# Patient Record
Sex: Male | Born: 1970 | Race: White | Hispanic: No | Marital: Married | Smoking: Former smoker
Health system: Southern US, Community
[De-identification: ages and names within clinical notes are randomized; demographics above are authoritative.]

## PROBLEM LIST (undated history)

## (undated) DIAGNOSIS — F32A Depression, unspecified: Secondary | ICD-10-CM

## (undated) HISTORY — PX: OTHER SURGICAL HISTORY: SHX169

## (undated) HISTORY — PX: KNEE SURGERY: SHX244

---

## 2000-01-29 ENCOUNTER — Encounter: Payer: Self-pay | Admitting: Family Medicine

## 2000-01-29 ENCOUNTER — Ambulatory Visit (HOSPITAL_COMMUNITY): Admission: RE | Admit: 2000-01-29 | Discharge: 2000-01-29 | Payer: Self-pay | Admitting: Family Medicine

## 2003-05-01 ENCOUNTER — Emergency Department (HOSPITAL_COMMUNITY): Admission: EM | Admit: 2003-05-01 | Discharge: 2003-05-01 | Payer: Self-pay | Admitting: Emergency Medicine

## 2010-01-16 ENCOUNTER — Ambulatory Visit (HOSPITAL_COMMUNITY): Admission: RE | Admit: 2010-01-16 | Discharge: 2010-01-16 | Payer: Self-pay | Admitting: Orthopedic Surgery

## 2010-03-03 ENCOUNTER — Encounter (HOSPITAL_COMMUNITY): Admission: RE | Admit: 2010-03-03 | Discharge: 2010-03-06 | Payer: Self-pay | Admitting: Orthopedic Surgery

## 2010-03-09 ENCOUNTER — Encounter (HOSPITAL_COMMUNITY): Admission: RE | Admit: 2010-03-09 | Discharge: 2010-04-08 | Payer: Self-pay | Admitting: Orthopedic Surgery

## 2010-04-10 ENCOUNTER — Encounter (HOSPITAL_COMMUNITY)
Admission: RE | Admit: 2010-04-10 | Discharge: 2010-05-10 | Payer: Self-pay | Source: Home / Self Care | Admitting: Orthopedic Surgery

## 2010-05-13 ENCOUNTER — Encounter (HOSPITAL_COMMUNITY)
Admission: RE | Admit: 2010-05-13 | Discharge: 2010-06-12 | Payer: Self-pay | Source: Home / Self Care | Attending: Orthopedic Surgery | Admitting: Orthopedic Surgery

## 2010-06-28 ENCOUNTER — Encounter: Payer: Self-pay | Admitting: Orthopedic Surgery

## 2011-03-02 ENCOUNTER — Ambulatory Visit (HOSPITAL_COMMUNITY)
Admission: RE | Admit: 2011-03-02 | Discharge: 2011-03-02 | Disposition: A | Discharge status: Home / Self Care | Payer: 59 | Source: Ambulatory Visit | Attending: Specialist | Admitting: Specialist

## 2011-03-02 DIAGNOSIS — M25669 Stiffness of unspecified knee, not elsewhere classified: Secondary | ICD-10-CM | POA: Insufficient documentation

## 2011-03-02 DIAGNOSIS — M25569 Pain in unspecified knee: Secondary | ICD-10-CM | POA: Insufficient documentation

## 2011-03-02 DIAGNOSIS — Z9889 Other specified postprocedural states: Secondary | ICD-10-CM | POA: Insufficient documentation

## 2011-03-02 DIAGNOSIS — IMO0001 Reserved for inherently not codable concepts without codable children: Secondary | ICD-10-CM | POA: Insufficient documentation

## 2011-03-02 DIAGNOSIS — M6281 Muscle weakness (generalized): Secondary | ICD-10-CM | POA: Insufficient documentation

## 2011-03-02 DIAGNOSIS — R262 Difficulty in walking, not elsewhere classified: Secondary | ICD-10-CM | POA: Insufficient documentation
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
## 2011-03-02 NOTE — Patient Instructions (Addendum)
A 

## 2011-03-09 ENCOUNTER — Ambulatory Visit (HOSPITAL_COMMUNITY)
Admission: RE | Admit: 2011-03-09 | Discharge: 2011-03-09 | Disposition: A | Discharge status: Home / Self Care | Payer: 59 | Source: Ambulatory Visit | Attending: Internal Medicine | Admitting: Internal Medicine

## 2011-03-09 DIAGNOSIS — M25669 Stiffness of unspecified knee, not elsewhere classified: Secondary | ICD-10-CM | POA: Insufficient documentation

## 2011-03-09 DIAGNOSIS — IMO0001 Reserved for inherently not codable concepts without codable children: Secondary | ICD-10-CM | POA: Insufficient documentation

## 2011-03-09 DIAGNOSIS — M6281 Muscle weakness (generalized): Secondary | ICD-10-CM | POA: Insufficient documentation

## 2011-03-09 DIAGNOSIS — R262 Difficulty in walking, not elsewhere classified: Secondary | ICD-10-CM | POA: Insufficient documentation

## 2011-03-09 DIAGNOSIS — M25569 Pain in unspecified knee: Secondary | ICD-10-CM | POA: Insufficient documentation

## 2011-03-09 NOTE — Progress Notes (Signed)
Physical Therapy Treatment Patient Details  Name: Justin Macdonald MRN: [id number] Date of Birth: 1970-10-22  Today's Date: 03/09/2011 Time: 4098-1191 Time Calculation (min): 34 min Charges: 30' TE, 4' Gt Visit#: 2  of 12   Re-eval: 04/01/11    Subjective: Symptoms/Limitations Symptoms: I have been working on my exercises at home and I have a little bit of a contraction.  No pain reported since Monday.  He has most nagging pain with SLR under the knee cap.  Pain Assessment Currently in Pain?: No/denies  Precautions/Restrictions  Precautions Precaution Comments: 50% for 6 weeks (11/6).  0-30 degrees for 3 weeks (until 10/11) , 0-70 6 weeks (11/6), no valgus  Mobility (including Balance)   Crutches 50% WB    Exercise/Treatments Stretches Passive Hamstring Stretch: 4 reps;30 seconds Aerobic   Machines for Strengthening   Plyometrics   Standing Knee Flexion: 15 reps Functional Squat: Limitations Functional Squat Limitations: this was not completed first session secondary to WB percautions. Error LMM Gait Training: 4' to encourage 50% WB with heel/toe walking and knee flexion Other Standing Knee Exercises: Tandem Stance w/R foot posterior and BUE support for 50% WB 3x30 sec Supine Quad Sets: 10 reps;Limitations Quad Sets Limitations: 10 sec hold Short Arc Quad Sets: 15 reps;Limitations Short Arc Quad Sets Limitations: small bolster 5" hold Straight Leg Raises: Right;10 reps Other Supine Knee Exercises: Hamstring/Quad Isometrics with heel into ground and knee at 30 degrees 10x10 sec hold Sidelying Hip ABduction: Right;10 reps Hip ADduction: Right;10 reps Prone  Hip Extension: Right;10 reps      Physical Therapy Assessment and Plan PT Assessment and Plan Clinical Impression Statement: Pt with notable improvement in quadricep contraction at the beginning and the end of treatment.  Continues to make significant progress in strength.  PT Plan: Follow protocol in  protocol book.  50% WB and 0-30 degrees until 03/11/11, 0-70 degrees until 04/08/11    Goals    Problem List Patient Active Problem List  Diagnoses  . S/P ACL reconstruction  . Knee pain  . Knee stiffness       Petrice Beedy 03/09/2011, 10:18 AM

## 2011-03-10 ENCOUNTER — Ambulatory Visit (HOSPITAL_COMMUNITY)
Admission: RE | Admit: 2011-03-10 | Discharge: 2011-03-10 | Disposition: A | Discharge status: Home / Self Care | Payer: 59 | Source: Ambulatory Visit | Attending: Specialist | Admitting: Specialist

## 2011-03-10 NOTE — Progress Notes (Addendum)
Physical Therapy Treatment Patient Details  Name: Justin Macdonald MRN: [id number] Date of Birth: Oct 20, 1970  Today's Date: 03/10/2011 Time: 4098-1191 Time Calculation (min): 41 min Charges: TE 20' Visit#: 3  of 12   Re-eval: 04/01/11    Subjective: Symptoms/Limitations Symptoms: Pt reports he still does not have any pain.  Comes in today initally without WB, and once cued to WB has appropriate heel to toe contact with crutches.  Pain Assessment Currently in Pain?: No/denies  Precautions/Restrictions  Precautions Precaution Comments: 50% for 6 weeks (11/6).  0-30 degrees for 3 weeks (until 10/11) , 0-70 6 weeks (11/6), no valgus  Mobility (including Balance)   Ambulating on crutches    Exercise/Treatments   Standing Heel Raises: 15 reps;Limitations Heel Raises Limitations: 50% WB Terminal Knee Extension: Right;10 reps;Limitations Terminal Knee Extension Limitations: 10 sec hold Functional Squat: 15 reps Functional Squat Limitations: 0-30 degress w/50% WB Other Standing Knee Exercises: 50% WB training to complete Standing exercises Other Standing Knee Exercises: Toe Raises 15x 50% WB Seated Long Arc Quad: Right;15 reps Supine Quad Sets: 10 reps;Limitations Quad Sets Limitations: 10 sec hold Short Arc [employer] Sets: 15 reps;Limitations Short Arc Quad Sets Limitations: 1 # small bolster Straight Leg Raises: Right;15 reps Other Supine Knee Exercises: Hamstring/Quad Isometrics with heel into ground and knee at 30 degrees 10x10 sec hold Other Supine Knee Exercises: Isometric Hip Adduction  Sidelying Hip ABduction: Right;15 reps Hip ADduction: Right;15 reps Prone  Hamstring Curl: 15 reps Hamstring Curl Limitations: 0-30 degrees only Hip Extension: Right;15 reps    Physical Therapy Assessment and Plan PT Assessment and Plan Clinical Impression Statement: Pt was able to independently demonstrate 50% WB status on scale today and completed standing activities while  maintaining status. Pt tolerated all new exercises well with moderate fatigue with endurance exercises.  PT Plan: Asses tolerance to today's treatment. Add 1# to [facility] and hamstring curls, HS curl in standing. Follow protocol.  50% WB and 0-30 degrees until 03/11/11, 0-70 degrees until 04/08/11.    Goals    Problem List Patient Active Problem List  Diagnoses  . S/P ACL reconstruction  . Knee pain  . Knee stiffness    PT - End of Session Activity Tolerance: Patient tolerated treatment well  Ronelle Michie 03/10/2011, 10:12 AM

## 2011-03-12 ENCOUNTER — Ambulatory Visit (HOSPITAL_COMMUNITY)
Admission: RE | Admit: 2011-03-12 | Discharge: 2011-03-12 | Disposition: A | Discharge status: Home / Self Care | Payer: 59 | Source: Ambulatory Visit

## 2011-03-12 NOTE — Progress Notes (Signed)
Physical Therapy Treatment Patient Details  Name: Justin Macdonald MRN: [id number] Date of Birth: 06/06/1971  Today's Date: 03/12/2011 Time: 4098-1191 Time Calculation (min): 50 min Visit#: 4  of 12   Re-eval: 04/01/11  Charge: therex 40 min Ice 10  Subjective: Symptoms/Limitations Symptoms: Knee feeling good, no pain today.  Placed foot on weight scale, pt following 50% WB precautions appropriately.  Entered with knee brace amb with crutches Pain Assessment Currently in Pain?: No/denies  Objective: amb with knee brace, crutches 50% WB status   Exercise/Treatments Standing- follow 50% WB status Heel Raises: 20 reps  Toe Raises 20 TKE 20x 10" Functional squats 2 sets 10 reps H/S Curl 20 reps Seated  Long Arc Quad: Right;20 reps 0-30 degrees Supine  Quad Sets: 20x 10"  Short Arc Quad Sets: 15 reps; 2# small bolster  Straight Leg Raises: Right;20 reps  Other Supine Knee Exercises: Hamstring/Quad Isometrics with heel into ground and knee at 30 degrees 15x10 sec hold  Sidelying  Hip ABduction: Right;20 reps  Hip ADduction: Right;20 reps  Prone  Hamstring Curl: 20 reps  Hamstring Curl Limitations: 0-30 degrees only  Hip Extension: Right;20 reps Ice to reduce swelling 10 min  Modalities Modalities: Cryotherapy Cryotherapy Number Minutes Cryotherapy: 10 Minutes Cryotherapy Location: Knee Type of Cryotherapy: Ice pack  Physical Therapy Assessment and Plan PT Assessment and Plan Clinical Impression Statement: Increased reps with no c/o, moderate quad fatigue noted with endurance exercises.  Pt independently demostrated 50% WB status and completed standing therex with proper WB status.  PT Plan: Continue with current POC, pt MD appt 03/15/2011.  Add 1# to SLR and hamstring curls next session.    Goals    Problem List Patient Active Problem List  Diagnoses  . S/P ACL reconstruction  . Knee pain  . Knee stiffness    PT - End of Session Activity Tolerance: Patient  tolerated treatment well General Behavior During Session: [facility] for tasks performed Cognition: [facility] for tasks performed  [name] 03/12/2011, 11:02 AM

## 2011-03-15 ENCOUNTER — Ambulatory Visit (HOSPITAL_COMMUNITY)
Admission: RE | Admit: 2011-03-15 | Discharge: 2011-03-15 | Disposition: A | Discharge status: Home / Self Care | Payer: 59 | Source: Ambulatory Visit | Attending: Specialist | Admitting: Specialist

## 2011-03-15 NOTE — Progress Notes (Signed)
Physical Therapy Treatment Patient Details  Name: Justin Macdonald MRN: [id number] Date of Birth: 09/23/1970  Today's Date: 03/15/2011 Time: 2440-1027 Time Calculation (min): 42 min Visit#: 5  of 12   Re-eval: 04/01/11  Charge: therex 40 min  Subjective: Symptoms/Limitations Symptoms: Pain free today, MD happy with progress.  Increased brace to 90 degrees flexion, continue touch down weight bearing til 6 weeks from surgery. Pain Assessment Currently in Pain?: No/denies  Objective:   Exercise/Treatments Standing- follow 50% WB status  Heel Raises: 20 reps  Toe Raises 20  TKE 20x 10"  Functional squats 2 sets 10 reps  H/S Curl 20 reps  Seated  Long Arc Quad: Right;20 reps  Supine  Quad Sets: 20x 10"  Short Arc Quad Sets: 15 reps; 2# large bolster  Straight Leg Raises: Right;20 reps 2# Other Supine Knee Exercises: Hamstring/Quad Isometrics with heel into ground and knee at 30 degrees 20x10 sec hold  Sidelying  Hip ABduction: Right;20 reps 2# Hip ADduction: Right;20 reps 2# Prone  Hamstring Curl: 20 reps 2# Hip Extension: Right;20 reps 2#   Physical Therapy Assessment and Plan PT Assessment and Plan Clinical Impression Statement: Strength improving, able to add 2# to therex without difficulty.  Pt able to achieve 45 degree knee flexion actively against gravity with 2#.  PT Plan: Continue POC per protocol.    Goals    Problem List Patient Active Problem List  Diagnoses  . S/P ACL reconstruction  . Knee pain  . Knee stiffness    PT - End of Session Activity Tolerance: Patient tolerated treatment well General Behavior During Session: [facility] for tasks performed Cognition: [facility] for tasks performed  [name] 03/15/2011, 1:46 PM

## 2011-03-17 ENCOUNTER — Ambulatory Visit (HOSPITAL_COMMUNITY)
Admission: RE | Admit: 2011-03-17 | Discharge: 2011-03-17 | Disposition: A | Discharge status: Home / Self Care | Payer: 59 | Source: Ambulatory Visit | Attending: Specialist | Admitting: Specialist

## 2011-03-17 NOTE — Progress Notes (Signed)
Physical Therapy Treatment Patient Details  Name: Justin Macdonald MRN: [id number] Date of Birth: 1970/10/13  Today's Date: 03/17/2011 Time: 1478-2956 Time Calculation (min): 54 min Visit#: 6  of 12   Re-eval: 04/01/11 Diagnosis: S/P R ACL, MCL allograft reconstruction w/lateral menisectomy Charges:  therex 38', ice 10'   Subjective:  Pt. States he's doing good today; no pain   Precautions/Restrictions  Precautions Precaution Comments: 50% for 6 weeks (11/6).  0-30 degrees for 3 weeks (until 10/11) , 0-70 6 weeks (11/6), no valgus  Exercise/Treatments  Standing- follow 50% WB status (until 6 wks post-op) Heel Raises: 20 reps  Toe Raises 20 reps TKE 20x 10" with blue theraband Functional squats 2 sets 10 reps  H/S Curl 20 reps with TKF using 4"step Seated  Long Arc Quad: Right;20 reps  Cybex hamstring 3pl BLE 2X10reps Cybex leg press 2.5pl RLE 2X10reps Supine  Quad Sets: 20x 10"  Short Arc Quad Sets: 15 reps; 2# large bolster  Straight Leg Raises: Right;20 reps 2#  Other Supine Knee Exercises: Hamstring/Quad Isometrics with heel into ground and knee at 30 degrees 20x10 sec hold  Sidelying  Hip ABduction: Right;20 reps 2#  Hip ADduction: Right;20 reps 2#  Prone  Hamstring Curl: 20 reps 2#  Hip Extension: Right;20 reps 2#  Modalities Modalities: Cryotherapy Cryotherapy Number Minutes Cryotherapy: 10 Minutes Cryotherapy Location: Knee Type of Cryotherapy: Ice pack  Physical Therapy Assessment and Plan PT Assessment and Plan Clinical Impression Statement: Added cybex strengthening machines for hamstrings and leg press in available ROM without pain.  Pt displays good strength and control with therex. PT Plan: Continue per protocol.    Problem List Patient Active Problem List  Diagnoses  . S/P ACL reconstruction  . Knee pain  . Knee stiffness    PT - End of Session Activity Tolerance: Patient tolerated treatment well General Behavior During Session: [facility] for  tasks performed Cognition: [facility] for tasks performed  Emeline Gins B 03/17/2011, 2:19 PM

## 2011-03-19 ENCOUNTER — Ambulatory Visit (HOSPITAL_COMMUNITY)
Admission: RE | Admit: 2011-03-19 | Discharge: 2011-03-19 | Disposition: A | Discharge status: Home / Self Care | Payer: 59 | Source: Ambulatory Visit | Attending: Internal Medicine | Admitting: Internal Medicine

## 2011-03-19 NOTE — Progress Notes (Addendum)
Physical Therapy Treatment Patient Details  Name: Justin Macdonald MRN: [id number] Date of Birth: 31-Dec-1970  Today's Date: 03/19/2011 Time: 6295-2841 Time Calculation (min): 45 min Charges: TE: 45' Visit#: 7  of 12   Re-eval: 04/01/11 Assessment Next MD Visit: 04/05/11  Subjective: Symptoms/Limitations Symptoms: I am doing all my exercises at home and it is going pretty well.  The leg extension machine hurt my knee a lot last time.  Pain Assessment Currently in Pain?: No/denies  Precautions/Restrictions  Precautions Precaution Comments: 50% weight bearing (11/6); 0-90 degrees (11/6).    Exercise/Treatments  03/19/11 0700  Knee Exercises: Stretches  Quad Stretch 3 reps;30 seconds  Knee Exercises: Standing  Heel Raises 20 reps  Heel Raises Limitations 50% WB  Knee Flexion 20 reps;Limitations  Knee Flexion Limitations 0-90 degree flexion with 50% WB  Functional Squat 20 reps  Functional Squat Limitations 0-90 degrees w/ 50% WB  Knee Exercises: Seated  Long Arc Quad Right;20 reps;Weights  Long Arc Quad Weight 3 lbs.  Long Arc Quad Limitations 0-90 degrees  Heel Slides Right;20 reps  Knee Exercises: Supine  Short Arc [employer] 20 reps  Short Arc Quad Sets Limitations 4#  Straight Leg Raises 20 reps;Limitations  Straight Leg Raises Limitations 4#  Other Supine Knee Exercises Hamstring/Quad Isometrics with heel into ground and knee at 30 degrees 10x10 sec hold  Knee Exercises: Sidelying  Hip ABduction Right;20 reps;Limitations  Hip ABduction Limitations 4#  Hip ADduction 20 reps;Limitations  Hip ADduction Limitations 4#  Knee Exercises: Prone  Hamstring Curl 20 reps;Limitations  Hamstring Curl Limitations 56 degrees  Hip Extension 20 reps  Hip Extension Limitations 4#     Manual Therapy Manual Therapy: Other (comment) Other Manual Therapy: PROM: 0-60  AROM: 0-56  Physical Therapy Assessment and Plan PT Assessment and Plan Clinical Impression Statement: Pt  tolerated all treatment well and has improved strength demonstrated by improved AROM to 56 degrees in prone.   PT Plan: Cont per protocol    Goals Home Exercise Program Pt will Perform Home Exercise Program: Independently PT Goal: Perform Home Exercise Program - Progress: Progressing toward goal PT Short Term Goals Time to Complete Short Term Goals: 4 weeks PT Short Term Goal 1: Pt will report pain less than 2/10 for 50% of his day. PT Short Term Goal 1 - Progress: Met PT Short Term Goal 2: Pt will ambulate w/WB precautions with appropriate gait mechanics. PT Short Term Goal 2 - Progress: Met PT Short Term Goal 3: Pt will improve his AROM 0-50 degrees. PT Short Term Goal 3 - Progress: Met PT Short Term Goal 4: Pt will improve his knee strength by 2 muscle grades to prepare for independent ambulation PT Short Term Goal 4 - Progress: Progressing toward goal PT Long Term Goals Time to Complete Long Term Goals: 12 weeks PT Long Term Goal 1: Pt will have LLE strength and power WNL in order to independently ascend and descend 3 flights of stairs w/reciprocal gait pattern without increase in pain. PT Long Term Goal 2: Pt will L knee strength to WNL ambulate independently greater than 2 hours in order to participate in community activities. Long Term Goal 3: Pt will improve her LEFS by 20 points for improved quality of life. Long Term Goal 4: Pt will improve L knee AROM to WNL in order to ambulate with appropriate gait biomechanics in order to decrease risk of secondary impairments.  PT Long Term Goal 5: Pt will improve LE power in order to  demonstrate 5 half kneel to stand without an increase in pain.   Problem List Patient Active Problem List  Diagnoses  . S/P ACL reconstruction  . Knee pain  . Knee stiffness       Veroncia Jezek 03/19/2011, 5:14 PM

## 2011-03-22 ENCOUNTER — Ambulatory Visit (HOSPITAL_COMMUNITY)
Admission: RE | Admit: 2011-03-22 | Discharge: 2011-03-22 | Disposition: A | Discharge status: Home / Self Care | Payer: 59 | Source: Ambulatory Visit | Attending: Specialist | Admitting: Specialist

## 2011-03-22 NOTE — Progress Notes (Signed)
Physical Therapy Treatment Patient Details  Name: Justin Macdonald MRN: [id number] Date of Birth: 1971-03-04  Today's Date: 03/22/2011 Time: 7829-5621 Time Calculation (min): 57 min Visit#: 8  of 12   Re-eval: 04/01/11 Assessment Next MD Visit: 04/05/11 Charges:  therex 35',  Manual 8', ice 10'  Subjective: Symptoms: Pt. eager to get his knee bending; states he's working on it at home.  Precautions/Restrictions  Precaution Comments: 50% weight bearing (11/6); 0-90 degrees (11/6).   Objective: Right Knee Flexion in Supine 68 degrees, in Prone after MFR 72 degrees.  Exercise/Treatments Standing- follow 50% WB status (until 6 wks post-op)  Heel Raises: 20 reps  Toe Raises 20 reps  TKE 20x 10" with blue theraband  Functional squats 2 sets 10 reps  H/S Curl 20 reps with TKF using 4"step  Seated  Long Arc Quad: 20 reps 4# Heelslides 15X10" Supine  Quad Sets: 20x 10"   Short Arc Quad Sets: 15 reps; 4# large bolster  Straight Leg Raises: Right;20 reps 4#  Sidelying  Hip ABduction: Right;20 reps 4#  Hip ADduction: Right;20 reps 4#  Prone  Hamstring Curl: 20 reps 4#  Terminal knee flexion 10X5 seconds Quad Stretch 3X30"  Modalities Modalities: Cryotherapy Manual Therapy Manual Therapy: Myofascial release Myofascial Release: To medial/lateral scar tissue Right knee Cryotherapy Number Minutes Cryotherapy: 10 Minutes Cryotherapy Location: Knee Type of Cryotherapy: Ice pack  Physical Therapy Assessment and Plan PT Assessment and Plan Clinical Impression Statement: added TKE in prone to increase endrange strength. Overall improving flexion with  AROM 68 degrees in prone; 72 degrees in supine following manual MFR. PT Treatment/Interventions: Therapeutic exercise (Manual techniques) PT Plan: Continue per protocol.  Returns to MD in 2 weeks (04/05/11).     Problem List Patient Active Problem List  Diagnoses  . S/P ACL reconstruction  . Knee pain  . Knee stiffness     PT - End of Session Activity Tolerance: Patient tolerated treatment well General Behavior During Session: [facility] for tasks performed Cognition: [facility] for tasks performed  Emeline Gins B 03/22/2011, 11:14 AM

## 2011-03-24 ENCOUNTER — Ambulatory Visit (HOSPITAL_COMMUNITY)
Admission: RE | Admit: 2011-03-24 | Discharge: 2011-03-24 | Disposition: A | Discharge status: Home / Self Care | Payer: 59 | Source: Ambulatory Visit

## 2011-03-24 NOTE — Progress Notes (Signed)
Physical Therapy Treatment Patient Details  Name: Justin Macdonald MRN: [id number] Date of Birth: 1970-12-19  Today's Date: 03/24/2011 Time: 5621-3086 Time Calculation (min): 63 min Visit#: 9  of 12   Re-eval: 04/01/11  Charge: therex 48 min Manual 10 min  Subjective: Symptoms/Limitations Symptoms: Pt pain free today, still following 50% WB precautions  Precautions/Restrictions  Precautions Precaution Comments: 50% weight bearing (11/6); 0-90 degrees (11/6).  Restrictions Weight Bearing Restrictions: Yes Other Position/Activity Restrictions: 50% WB  Objective: AAROM 82 degrees prone   Exercise/Treatments Standing- follow 50% WB status (until 6 wks post-op)  Heel Raises: 20 reps  Toe Raises 20 reps  TKE 20x 10" with blue theraband  Functional squats 2 sets 10 reps  H/S Curl 20 reps with TKF using 4"step  Seated  Long Arc Quad: 2 sets; 20 reps 4#  Heelslides 20X10"  Cybex quad 1.5 1 sets 10 reps BLE, 1 set 10 reps R LE Cybex Supine  Quad Sets: 20x 10"  Short Arc Quad Sets: 15 reps; 4# large bolster  Straight Leg Raises: Right;20 reps 4#  Sidelying  Hip ABduction: Right;20 reps 4#  Hip ADduction: Right;20 reps 4#  Prone  Hamstring Curl: 20 reps 4#  Terminal knee flexion 10X5 seconds  Straight leg raise 20 reps 4# Quad Stretch 3X30" Ice at home today   Manual Therapy Manual Therapy: Myofascial release Myofascial Release: To medial/lateral scar tissue right knee  Physical Therapy Assessment and Plan PT Assessment and Plan Clinical Impression Statement: Pt tolerated well towards total treatment, with flexion improving overall with AROM 82 degrees in prone.  Pt able to complete cybex knee extension start position 7 without c/o. PT Plan: Continue per protocol, returns to MD in 04/05/2011.    Goals    Problem List Patient Active Problem List  Diagnoses  . S/P ACL reconstruction  . Knee pain  . Knee stiffness    PT - End of Session Activity Tolerance:  Patient tolerated treatment well General Behavior During Session: [facility] for tasks performed Cognition: [facility] for tasks performed  [name] 03/24/2011, 11:21 AM

## 2011-03-26 ENCOUNTER — Ambulatory Visit (HOSPITAL_COMMUNITY)
Admission: RE | Admit: 2011-03-26 | Discharge: 2011-03-26 | Disposition: A | Discharge status: Home / Self Care | Payer: 59 | Source: Ambulatory Visit | Attending: Internal Medicine | Admitting: Internal Medicine

## 2011-03-26 NOTE — Progress Notes (Signed)
Physical Therapy Treatment Patient Details  Name: Justin Macdonald MRN: [id number] Date of Birth: [birth year]  Today's Date: 03/26/2011 Time: 4098-1191 Time Calculation (min): 44 min Charges: TE: 39' Manual: 5' Visit#: 10  of 12   Re-eval: 04/01/11    Subjective: Symptoms/Limitations Symptoms: Pt reports he is doing well overall.  No pain or swelling.  Pain Assessment Currently in Pain?: No/denies  Exercise/Treatments Stretches Quad Stretch: 3 reps;30 seconds Aerobic Stationary Bike: Schwinn to 90 degrees x8 minutes for ROM Machines for Strengthening Cybex Knee Extension: 1.5 PL B LE start position 7 Cybex Knee Flexion: 3.5 PL 2x10 RLE only Plyometrics   Standing Heel Raises: 20 reps Heel Raises Limitations: 50% WB Knee Flexion: 20 reps Knee Flexion Limitations: 0-90 degree flexion with 50% WB Functional Squat: 20 reps Functional Squat Limitations: 0-90 degrees w/ 50% WB Seated   Supine Straight Leg Raises: 20 reps;Limitations Straight Leg Raises Limitations: 4# Sidelying Hip ABduction: Right;20 reps;Limitations Hip ABduction Limitations: 4# Hip ADduction: 20 reps;Limitations Hip ADduction Limitations: 4# Prone  Hamstring Curl: 20 reps Hamstring Curl Limitations: with 4#, TKE without weight 10 reps Hip Extension: 20 reps Hip Extension Limitations: 4#   Manual Therapy Manual Therapy: Joint mobilization Joint Mobilization: Grade I-II joint mobs to increase knee  flexion w/PROM to follow. x5 minutes  Physical Therapy Assessment and Plan PT Assessment and Plan Clinical Impression Statement: Pt continues to show improvement with all activities.  He continues to be limited secondary to WB status.   PT Plan: Cont per protocol.     Goals    Problem List Patient Active Problem List  Diagnoses  . S/P ACL reconstruction  . Knee pain  . Knee stiffness    PT - End of Session Activity Tolerance: Patient tolerated treatment well  Justin Macdonald 03/26/2011,  10:41 AM

## 2011-03-29 ENCOUNTER — Ambulatory Visit (HOSPITAL_COMMUNITY)
Admission: RE | Admit: 2011-03-29 | Discharge: 2011-03-29 | Disposition: A | Discharge status: Home / Self Care | Payer: 59 | Source: Ambulatory Visit | Attending: Specialist | Admitting: Specialist

## 2011-03-29 NOTE — Progress Notes (Signed)
Physical Therapy Treatment Patient Details  Name: Justin Macdonald MRN: [id number] Date of Birth: 08-Feb-1971  Today's Date: 03/29/2011 Time: 4098-1191 Time Calculation (min): 58 min Visit#: 11  of 12   Re-eval: 04/01/11 Assessment Next MD Visit: 04/05/11 Charges:  therex 40', manual 5', icepack 1 unit  Subjective: Symptoms/Limitations Symptoms: Pt. states he's been working on it; able to make full revolutions on recumbent bike today with seat on 11. Pain Assessment Currently in Pain?: No/denies  Precautions/Restrictions  Precautions Precaution Comments: 50% weight bearing (11/6); 0-90 degrees (11/6).  Restrictions Weight Bearing Restrictions: Yes Other Position/Activity Restrictions: 50% WB  Objective:  AAROM R knee 90 degrees flexion today, however 5/10 pain with end ROM  Exercise/Treatments Stretches Quad Stretch: 3 reps;30 seconds;Limitations Lobbyist Limitations: PRONE Aerobic Stationary Bike: Recumbent bike 8' @ 3.0, seat 11 Machines for Strengthening Cybex Knee Extension: 1.5 PL B LE start position 7 Cybex Knee Flexion: 3.5 PL 2x10 RLE only Standing Heel Raises: 20 reps Heel Raises Limitations: 50% WB Knee Flexion: 20 reps Knee Flexion Limitations: 0-90 degree flexion with 50% WB Terminal Knee Extension: 20 reps;Limitations;Theraband Theraband Level (Terminal Knee Extension): Level 4 (Blue) Terminal Knee Extension Limitations: 10 sec hold Functional Squat: 20 reps Functional Squat Limitations: 0-90 degrees w/ 50% WB Supine Short Arc Quad Sets: 20 reps Short Arc Quad Sets Limitations: 4# Straight Leg Raises: 20 reps;Limitations Straight Leg Raises Limitations: 4# Sidelying Hip ABduction: Right;20 reps;Limitations Hip ABduction Limitations: 4# Hip ADduction: 20 reps;Limitations Hip ADduction Limitations: 4# Prone  Hamstring Curl: 20 reps Hamstring Curl Limitations: with 4#, TKE without weight 10 reps Hip Extension: 20 reps Hip Extension  Limitations: 4#   Modalities Modalities: Cryotherapy Manual Therapy Manual Therapy: Joint mobilization Myofascial Release: to medial/lateral scar tissue R knee Other Manual Therapy: PROM/ AAROM to 90 degrees today Cryotherapy Number Minutes Cryotherapy: 10 Minutes Cryotherapy Location: Knee Type of Cryotherapy: Ice pack  Physical Therapy Assessment and Plan PT Assessment and Plan Clinical Impression Statement: Improved AROM today with ability to make full revolutions on bike today. PT Treatment/Interventions: Therapeutic exercise;Other (comment) (Manual techniques, icepack) PT Plan: Continue.  RE-eval this week (has 2 more visits) returns to MD on Monday (Oct. 29,2012).     Problem List Patient Active Problem List  Diagnoses  . S/P ACL reconstruction  . Knee pain  . Knee stiffness    PT - End of Session Activity Tolerance: Patient tolerated treatment well General Behavior During Session: [facility] for tasks performed Cognition: [facility] for tasks performed  Bascom Levels, Amy B 03/29/2011, 10:20 AM

## 2011-03-31 ENCOUNTER — Ambulatory Visit (HOSPITAL_COMMUNITY)
Admission: RE | Admit: 2011-03-31 | Discharge: 2011-03-31 | Disposition: A | Discharge status: Home / Self Care | Payer: 59 | Source: Ambulatory Visit | Attending: Specialist | Admitting: Specialist

## 2011-03-31 NOTE — Progress Notes (Signed)
Physical Therapy Treatment Progress Note Patient Details  Name: HIRAN LEARD MRN: [id number] Date of Birth: 26-Dec-1970  Today's Date: 03/31/2011 Time: 1478-2956 Time Calculation (min): 46 min Charges: 1 ROM, 1 MMT, 35' TE Visit#: 12  of 24   Re-eval: 04/30/11 Assessment Next MD Visit: 04/05/11  Subjective: Symptoms/Limitations Symptoms: I am really starting to feel my work outs.   Pain Assessment Currently in Pain?: No/denies  Precautions/Restrictions  Precautions Precaution Comments: 50% weight bearing (11/6); 0-90 degrees (11/6).  Restrictions Weight Bearing Restrictions: Yes Other Position/Activity Restrictions: 50% WB  Exercise/Treatments Stretches Quad Stretch: 3 reps;30 seconds;Limitations Lobbyist Limitations: Prone: 90 degrees Aerobic Stationary Bike: Schwinn x6 min for ROM Machines for Strengthening Cybex Knee Extension: 2 PL RLE only 20x w/min A from LLE when needed Cybex Knee Flexion: 4 PL 2x10 RLE Standing Knee Flexion: 20 reps Knee Flexion Limitations: 0-90 degree flexion with 50% WB Seated Other Seated Knee Exercises: Seated Calf Raise on Cybex 2x20 9PL Supine Straight Leg Raises: 20 reps Straight Leg Raises Limitations: 5# Sidelying Hip ABduction: Right;20 reps;Limitations Hip ABduction Limitations: 5# Hip ADduction: 20 reps;Limitations Hip ADduction Limitations: 5# Prone  Hamstring Curl: 20 reps Hamstring Curl Limitations: 5# Hip Extension: 20 reps Hip Extension Limitations: 5#      Physical Therapy Assessment and Plan PT Assessment and Plan Clinical Impression Statement: Progress note complete. Mr. Merrow has made significant progress towards his overall recovery.  He has met all of his STG and is limited in his LTG secondary to current WB and ROM restrictions.  He has full strength to his RLE w/manual testing, however is limited w/1 rep max quad extension.  He will continue to benefit from skilled PT in order to address  impairments including difficulty independently walking, decreased functional ROM, decreased functional strength, balance and impaired percieved functional ability in order to safely return to community activities which include work and work out activities.  Rehab Potential: Good PT Frequency: Min 3X/week PT Duration: 8 weeks PT Treatment/Interventions: Gait training;Stair training;Functional mobility training;Therapeutic exercise;Balance training;Patient/family education;Other (comment) (Manual and Modalities to improve ROM) PT Plan: Continue to progress per MD orders.  Cont with current restricitons until his visit with MD on Monday 10/29    Goals Home Exercise Program Pt will Perform Home Exercise Program: Independently PT Short Term Goals Time to Complete Short Term Goals: 4 weeks PT Short Term Goal 1: Pt will report pain less than 2/10 for 50% of his day. PT Short Term Goal 1 - Progress: Met PT Short Term Goal 2: Pt will ambulate w/WB precautions with appropriate gait mechanics. PT Short Term Goal 2 - Progress: Met PT Short Term Goal 3: Pt will improve his AROM 0-50 degrees. PT Short Term Goal 3 - Progress: Met PT Short Term Goal 4: Pt will improve his knee strength by 2 muscle grades to prepare for independent ambulation PT Long Term Goals Time to Complete Long Term Goals: 12 weeks PT Long Term Goal 1: Pt will have LLE strength and power WNL in order to independently ascend and descend 3 flights of stairs w/reciprocal gait pattern without increase in pain. PT Long Term Goal 1 - Progress: Not met PT Long Term Goal 2: Pt will L knee strength to WNL ambulate independently greater than 2 hours in order to participate in community activities. PT Long Term Goal 2 - Progress: Not met Long Term Goal 3: Pt will improve her LEFS by 20 points for improved quality of life. Long Term Goal 3 Progress: Not  met Long Term Goal 4: Pt will improve L knee AROM to WNL in order to ambulate with appropriate  gait biomechanics in order to decrease risk of secondary impairments.  Long Term Goal 4 Progress: Not met PT Long Term Goal 5: Pt will improve LE power in order to demonstrate 5 half kneel to stand without an increase in pain.  Long Term Goal 5 Progress: Not met  Problem List Patient Active Problem List  Diagnoses  . S/P ACL reconstruction  . Knee pain  . Knee stiffness    PT - End of Session Activity Tolerance: Patient tolerated treatment well  Vassie Kugel 03/31/2011, 12:19 PM

## 2011-04-02 ENCOUNTER — Ambulatory Visit (HOSPITAL_COMMUNITY)
Admission: RE | Admit: 2011-04-02 | Discharge: 2011-04-02 | Disposition: A | Discharge status: Home / Self Care | Payer: 59 | Source: Ambulatory Visit | Attending: Internal Medicine | Admitting: Internal Medicine

## 2011-04-02 NOTE — Progress Notes (Signed)
Physical Therapy Treatment Patient Details  Name: Justin Macdonald MRN: [id number] Date of Birth: 1970-12-04  Today's Date: 04/02/2011 Time: 4098-1191 Time Calculation (min): 41 min Visit#: 13  of 24   Re-eval: 04/30/11  Next MD appt. 04/05/2011 Charge: therex 35 min  Subjective: Symptoms/Limitations Symptoms: No pain today, go to MD on Monday. Feel like I'm making good improvements but have far to go. Pain Assessment Currently in Pain?: No/denies  Objective:   Exercise/Treatments Stretches  Quad Stretch: 3 reps;30 seconds;Limitations  Lobbyist Limitations: Prone: 90 degrees  Aerobic  Stationary Bike: Schwinn x6 min for ROM  Machines for Strengthening  Cybex Knee Extension: 2 PL RLE only 20x w/min A from LLE when needed R only slow eccentric control descending Cybex Knee Flexion: 5 PL 2x10 RLE  Seated Calf Raise on Cybex 2x20 9PL  Standing  Knee Flexion: 20 reps  Knee Flexion Limitations: 0-90 degree flexion with 50% WB  Heel raises 20 reps with 50% WB Functional squats 20 reps with 50% WB Supine  Straight Leg Raises: 20 reps  Straight Leg Raises Limitations: 5#  Sidelying  Hip ABduction: Right;20 reps;Limitations  Hip ABduction Limitations: 5#  Hip ADduction: 20 reps;Limitations  Hip ADduction Limitations: 5#  Prone  Hamstring Curl: 20 reps  Hamstring Curl Limitations: 5#  Hip Extension: 20 reps  Hip Extension Limitations: 5#   Physical Therapy Assessment and Plan PT Assessment and Plan Clinical Impression Statement: Pt continues to show overall strength improvement within current restrictions.  All therex complete without difficutly, pt stated slight discomfort with adduction sidelying therex activity. PT Plan: Continue to progress per MD orders. Cont with current restricitons until his visit with MD on Monday     Goals    Problem List Patient Active Problem List  Diagnoses  . S/P ACL reconstruction  . Knee pain  . Knee stiffness    PT - End of  Session Activity Tolerance: Patient tolerated treatment well General Behavior During Session: [facility] for tasks performed Cognition: [facility] for tasks performed  [name] 04/02/2011, 11:46 AM

## 2011-04-05 ENCOUNTER — Ambulatory Visit (HOSPITAL_COMMUNITY)
Admission: RE | Admit: 2011-04-05 | Discharge: 2011-04-05 | Disposition: A | Discharge status: Home / Self Care | Payer: 59 | Source: Ambulatory Visit | Attending: Specialist | Admitting: Specialist

## 2011-04-05 NOTE — Progress Notes (Signed)
Physical Therapy Treatment Patient Details  Name: Justin Macdonald MRN: [id number] Date of Birth: 03-May-1971  Today's Date: 04/05/2011 Time: 4098-1191 Time Calculation (min): 43 min Visit#: 14  of 24   Re-eval: 04/30/11 Charges:  therex 40'    Subjective: Symptoms/Limitations Symptoms: Pt. reports MD was pleased with progress, can begin gradual FWB, can sleep without brace  but kept brace locked at 90 degrees.  States he is getting fitted for another brace he will get in 2 weeks.  Pt. ambulated into department today with one crutch, FWB onto R LE. Pain Assessment Currently in Pain?: No/denies   Exercise/Treatments Aerobic Stationary Bike: Recumbent seat 10 6'@2 .0 full revs Machines for Strengthening Cybex Knee Extension: 2 PL RLE only 20x w/min A from LLE when needed Cybex Knee Flexion: 4.5 PL R LE 2x 20 starting position at level 5 Cybex Leg Press: 9PL calf raises on quad machine R LE only Standing Heel Raises: 20 reps Knee Flexion: 20 reps Functional Squat: 20 reps Supine Short Arc Quad Sets: 20 reps Straight Leg Raises: 20 reps Straight Leg Raises Limitations: 5# Sidelying Hip ABduction: 20 reps;Limitations Hip ABduction Limitations: 5# Hip ADduction Limitations: HOLD Prone  Hamstring Curl: 20 reps Hamstring Curl Limitations: 5# Hip Extension: 20 reps Hip Extension Limitations: 5#   Physical Therapy Assessment and Plan PT Assessment and Plan Clinical Impression Statement: Pt. able to complete all therex without difficulty.  Evaluating therapist has contacted MD office regarding ROM and continued restrictions for increasing activity and awaiting return call.   PT Treatment/Interventions: Therapeutic exercise PT Plan: To progress POC according to MD orders.     Problem List Patient Active Problem List  Diagnoses  . S/P ACL reconstruction  . Knee pain  . Knee stiffness    PT - End of Session Activity Tolerance: Patient tolerated treatment  well General Behavior During Session: [facility] for tasks performed Cognition: [facility] for tasks performed  Emeline Gins B 04/05/2011, 1:57 PM

## 2011-04-07 ENCOUNTER — Ambulatory Visit (HOSPITAL_COMMUNITY)
Admission: RE | Admit: 2011-04-07 | Discharge: 2011-04-07 | Disposition: A | Discharge status: Home / Self Care | Payer: 59 | Source: Ambulatory Visit | Attending: Internal Medicine | Admitting: Internal Medicine

## 2011-04-07 NOTE — Progress Notes (Signed)
Physical Therapy Treatment Patient Details  Name: Justin Macdonald MRN: [id number] Date of Birth: [birth year]  Today's Date: 04/07/2011 Time: 2130-8657 Time Calculation (min): 42 min Visit#: 15  of 24   Re-eval: 04/30/11  Charge: therex 34 min  Subjective: Symptoms/Limitations Symptoms: N Pain Assessment Currently in Pain?: No/denies  Objective:   Exercise/Treatments Aerobic Stationary Bike: Recumbent seat 10 8' @ 2.5 Machines for Strengthening Cybex Knee Extension: 2 PL RLE only 20x w/min A from LLE when needed Cybex Knee Flexion: 4.5 PL R LE 2x 20 starting position at level 5 Cybex Leg Press: 9PL calf raises on quad machine R LE only 2x 20 Total Gym Leg Press: 8.5 PL 20x B LE; 6.5 PL 20x R LE Standing Wall Squat: 5 sets;Limitations Wall Squat Limitations: 20 sec holds Other Standing Knee Exercises: Heel/toe walking 1 RT Seated Stool Scoot - Round Trips: 2RT B LE  Supine Short Arc Quad Sets: 20 reps;Limitations Straight Leg Raises: 20 reps Straight Leg Raises Limitations: 5# Sidelying Hip ABduction: 20 reps;Limitations Hip ABduction Limitations: 5# Hip ADduction Limitations: HOLD Prone  Hamstring Curl: 20 reps Hamstring Curl Limitations: 5# Hip Extension: 20 reps Hip Extension Limitations: 5#      Physical Therapy Assessment and Plan PT Assessment and Plan Clinical Impression Statement: Received signed order stating no MCL stressing following MCL reconstruction.  Progressed squats to wall slides with visible quad fatigue.  Pt completed all therex without difficulty.. PT Plan: Continue with current POC following protocol.    Goals    Problem List Patient Active Problem List  Diagnoses  . S/P ACL reconstruction  . Knee pain  . Knee stiffness    PT - End of Session Activity Tolerance: Patient tolerated treatment well General Behavior During Session: [facility] for tasks performed Cognition: [facility] for tasks performed  [name] 04/07/2011, 9:33  AM

## 2011-04-09 ENCOUNTER — Ambulatory Visit (HOSPITAL_COMMUNITY)
Admission: RE | Admit: 2011-04-09 | Discharge: 2011-04-09 | Disposition: A | Discharge status: Home / Self Care | Payer: 59 | Source: Ambulatory Visit | Attending: Specialist | Admitting: Specialist

## 2011-04-09 DIAGNOSIS — IMO0001 Reserved for inherently not codable concepts without codable children: Secondary | ICD-10-CM | POA: Insufficient documentation

## 2011-04-09 DIAGNOSIS — R262 Difficulty in walking, not elsewhere classified: Secondary | ICD-10-CM | POA: Insufficient documentation

## 2011-04-09 DIAGNOSIS — M6281 Muscle weakness (generalized): Secondary | ICD-10-CM | POA: Insufficient documentation

## 2011-04-09 DIAGNOSIS — M25669 Stiffness of unspecified knee, not elsewhere classified: Secondary | ICD-10-CM | POA: Insufficient documentation

## 2011-04-09 DIAGNOSIS — M25569 Pain in unspecified knee: Secondary | ICD-10-CM | POA: Insufficient documentation

## 2011-04-09 NOTE — Progress Notes (Signed)
Physical Therapy Treatment Patient Details  Name: Justin Macdonald MRN: [id number] Date of Birth: [birth year]  Today's Date: 04/09/2011 Time: 4098-1191 Time Calculation (min): 43 min Charges: Therex: 43 Visit#: 16  of 24   Re-eval: 04/30/11    Subjective: Symptoms/Limitations Symptoms: Pt reports he is doing well and he is working on his strengthening at home until fatigue.  He has been attending the gym and is completeing 50lbs on the leg press with BLE.  He reports he is having the greatest difficulty with stride.  Pain Assessment Currently in Pain?: No/denies  Exercise/Treatments Aerobic Elliptical: 12 min, 5.0  Machines for Strengthening Cybex Knee Extension: 1.5 PL w/R LE ecc lowering 30x Cybex Knee Flexion: 5 PL 20x RLE Total Gym Leg Press: Power Tower Leg Press at 26 degree angle 3x10 RLE only Standing Heel Raises: 20 reps;Limitations Heel Raises Limitations: power tower 26 degrees RLE only Forward Lunges: Right;Left;5 reps Wall Squat: 3 sets;Limitations Wall Squat Limitations: 30 sec SLS with Vectors: 2 RT 5 sec each direction.    Physical Therapy Assessment and Plan PT Assessment and Plan Clinical Impression Statement: Pt tolerated all increased activities without any pain.  Continues to mild swelling to the R knee with mild atrophy to distal quad.  when asked to focus on gait, had improved stride and gait mechanics.  PT Plan: Assess pain after today's treatment.  Cont per protocol and closed chain activities.  may consider adding H/S curls on ball.     Goals    Problem List Patient Active Problem List  Diagnoses  . S/P ACL reconstruction  . Knee pain  . Knee stiffness    PT - End of Session Activity Tolerance: Patient tolerated treatment well  Charlie Seda 04/09/2011, 10:18 AM

## 2011-04-13 ENCOUNTER — Telehealth (HOSPITAL_COMMUNITY): Payer: Self-pay

## 2011-04-13 ENCOUNTER — Ambulatory Visit (HOSPITAL_COMMUNITY): Payer: 59 | Admitting: Physical Therapy

## 2011-04-14 ENCOUNTER — Ambulatory Visit (HOSPITAL_COMMUNITY)
Admission: RE | Admit: 2011-04-14 | Discharge: 2011-04-14 | Disposition: A | Discharge status: Home / Self Care | Payer: 59 | Source: Ambulatory Visit | Attending: Internal Medicine | Admitting: Internal Medicine

## 2011-04-14 NOTE — Progress Notes (Signed)
Physical Therapy Treatment Patient Details  Name: Justin Macdonald MRN: [id number] Date of Birth: 04-30-1971  Today's Date: 04/14/2011 Time: 1308-6578 Time Calculation (min): 41 min Visit#: 17  of 24   Re-eval: 04/30/11 Charges: Therex x 29'   Subjective: Symptoms/Limitations Symptoms: Pt reprots he is feeling well today. Pain Assessment Currently in Pain?: No/denies Pain Score: 0-No pain   Exercise/Treatments Aerobic Elliptical: 12 min, 6.0  Machines for Strengthening Cybex Knee Extension: 1.5 PL w/R LE ecc lowering 30x Cybex Knee Flexion: 5 PL 30x RLE Cybex Leg Press: 9PL calf press R LE only 2x 20 Total Gym Leg Press: Power Tower Leg Press at 26 degree angle 3x10 RLE only Standing Heel Raises: 20 reps;2 sets;Limitations Heel Raises Limitations: power tower 26 degrees RLE only Forward Lunges: Right;5 reps Wall Squat: 3 sets;Limitations Wall Squat Limitations: 30 sec SLS with Vectors: 2 RT 5 sec each direction.  Physical Therapy Assessment and Plan PT Assessment and Plan Clinical Impression Statement: Pt completes therex with minimal difficulty. Pt displays some shiking secondary to muscle fatigue with quad exercises. Pt reprots no increase in pain atend of session.  PT Treatment/Interventions: Therapeutic exercise PT Plan: Continue per protocol.     Problem List Patient Active Problem List  Diagnoses  . S/P ACL reconstruction  . Knee pain  . Knee stiffness    PT - End of Session Activity Tolerance: Patient tolerated treatment well General Behavior During Session: [facility] for tasks performed Cognition: [facility] for tasks performed  Antonieta Iba 04/14/2011, 10:37 AM

## 2011-04-16 ENCOUNTER — Ambulatory Visit (HOSPITAL_COMMUNITY)
Admission: RE | Admit: 2011-04-16 | Discharge: 2011-04-16 | Disposition: A | Discharge status: Home / Self Care | Payer: 59 | Source: Ambulatory Visit | Attending: Internal Medicine | Admitting: Internal Medicine

## 2011-04-16 NOTE — Progress Notes (Signed)
Physical Therapy Treatment Patient Details  Name: Justin Macdonald MRN: [id number] Date of Birth: 1970/08/31  Today's Date: 04/16/2011 Time: 4098-1191 Time Calculation (min): 44 min Charges: 10' TE, 8' manual Visit#: 18  of 24   Re-eval: 04/30/11    Subjective: Symptoms/Limitations Symptoms: Doing well. Lifting at the gym he felt that he knee went out on him 2 x while walking between machines when he was exercising his arms, no pain when it gives out.    Exercise/Treatments Stretches Lobbyist: 3 reps;60 seconds Aerobic Elliptical: 10' 6.0 Standing Heel Raises Limitations: Power Ups w/walking 3 RT Forward Lunges: Right;10 reps Side Lunges: Limitations Side Lunges Limitations: Walking Side Squats w/blue tband around ankles Wall Squat Limitations: 3x30 sec Rebounder: 2x15 alternating hands Red ball Supine Other Supine Knee Exercises: Hamstring circuit on ball: Bridging x15, Roll Ups x10, Heel Push Ups x10   Manual Therapy Manual Therapy: Joint mobilization Joint Mobilization: Grade I-II joint mobs to increase knee flexion w/PROM to follow. x8 minutes  AROM: 0-90, PROM: 0-102  Physical Therapy Assessment and Plan PT Assessment and Plan Clinical Impression Statement: Pt tolerated all new ther-ex well.  he continues to be limited by funcitonal knee flexion.  Cont to be conservative with knee flexion to allow healing time.  PT Plan: Cont to progress per protocol.  Gentle manual to progress ROM to 115 degrees    Goals    Problem List Patient Active Problem List  Diagnoses  . S/P ACL reconstruction  . Knee pain  . Knee stiffness    PT - End of Session Activity Tolerance: Patient tolerated treatment well  Zohaib Heeney 04/16/2011, 10:23 AM

## 2011-04-19 ENCOUNTER — Ambulatory Visit (HOSPITAL_COMMUNITY)
Admission: RE | Admit: 2011-04-19 | Discharge: 2011-04-19 | Disposition: A | Discharge status: Home / Self Care | Payer: 59 | Source: Ambulatory Visit | Attending: Internal Medicine | Admitting: Internal Medicine

## 2011-04-19 NOTE — Progress Notes (Signed)
Physical Therapy Treatment Patient Details  Name: Justin Macdonald MRN: [id number] Date of Birth: 13-Aug-1970  Today's Date: 04/19/2011 Time: 4401-0272 Time Calculation (min): 40 min Charges: TE x 40' Visit#: 19  of 24   Re-eval: 04/30/11    Subjective: Symptoms/Limitations Symptoms: I just came from the [facility].  Pain Assessment Currently in Pain?: No/denies  Exercise/Treatments Aerobic Elliptical: 12' 6.0  Isokinetic: 10x at 150, 120, 90 Standing Forward Lunges: Right;Left;10 reps;Limitations Forward Lunges Limitations: 8# in hands, alternating feet Functional Squat Limitations: Side walking squats 2 RT w/blue band and 8# weights Walking with Sports Cord: 5 RT 4 directions    Physical Therapy Assessment and Plan PT Assessment and Plan Clinical Impression Statement: Continues to improve his overall strength ROM.  Tolerated new exercises including sports cord and isokinectic strengthening well.  had improved posture with weights in his hands during lunging and squatting activities.  PT Plan: Cont to progress.     Goals    Problem List Patient Active Problem List  Diagnoses  . S/P ACL reconstruction  . Knee pain  . Knee stiffness    PT - End of Session Activity Tolerance: Patient tolerated treatment well  Andelyn Spade 04/19/2011, 11:02 AM

## 2011-04-21 ENCOUNTER — Ambulatory Visit (HOSPITAL_COMMUNITY)
Admission: RE | Admit: 2011-04-21 | Discharge: 2011-04-21 | Disposition: A | Discharge status: Home / Self Care | Payer: 59 | Source: Ambulatory Visit | Attending: Physical Therapy | Admitting: Physical Therapy

## 2011-04-21 NOTE — Progress Notes (Signed)
Physical Therapy Treatment Patient Details  Name: Justin Macdonald MRN: [id number] Date of Birth: 1970-10-24  Today's Date: 04/21/2011 Time: 0930-1010 Time Calculation (min): 40 min Charges: TE x40  Visit#: 20  of 24   Re-eval: 04/30/11   Subjective: Symptoms/Limitations Symptoms: No pain today. Objective: Exercises performed by PTT - Nicholes Rough with direct supervision from PT  Pain Assessment Currently in Pain?: No/denies  Exercise/Treatments Aerobic Elliptical: 13' 6.0  Isokinetic: Ramp Up and down 10x at 180, 150, 120, 90 Standing Forward Lunges: Right;Left;Limitations;20 reps Forward Lunges Limitations: 8# in hands, alternating feet Functional Squat Limitations: Side walking squats 2 RT w/blue band and 8# weights Walking with Sports Cord: 5 RT 4 directions     Physical Therapy Assessment and Plan PT Assessment and Plan Clinical Impression Statement: Pt continues to improve his overall strength, endurance and ROM without an increase in pain. Continues to require cueing for appropriate gait mechanics at end of session.  PT Plan: Cont to progress per protocol    Goals    Problem List Patient Active Problem List  Diagnoses  . S/P ACL reconstruction  . Knee pain  . Knee stiffness    PT - End of Session Activity Tolerance: Patient tolerated treatment well  Areeb Corron 04/21/2011, 3:17 PM

## 2011-04-23 ENCOUNTER — Ambulatory Visit (HOSPITAL_COMMUNITY)
Admission: RE | Admit: 2011-04-23 | Discharge: 2011-04-23 | Disposition: A | Discharge status: Home / Self Care | Payer: 59 | Source: Ambulatory Visit | Attending: Internal Medicine | Admitting: Internal Medicine

## 2011-04-23 NOTE — Progress Notes (Signed)
Physical Therapy Treatment Patient Details  Name: Justin Macdonald MRN: [id number] Date of Birth: [birth year]  Today's Date: 04/23/2011 Time: [phone]  Charges: 20' manual, 28' TE Visit#: 21 of 24 Re-eval:  04/30/11    Subjective: Symptoms/Limitations Symptoms: I am not having much pain.  Objective: Pt continues to display difficulty with appropriate mechanics secondary to decrease in functional knee strength at end range of motion Pain Assessment Currently in Pain?: No/denies  Exercise/Treatments Stretches  Quad Stretch 3x1 minute Aerobic Elliptical: 10 6.0 Isokinetic: Ramp Up and down 10x at 180, 150, 120, 90 Supine Other Supine Knee Exercises: Hamstring circuit on ball: Bridging x15, Roll Ups x10, Heel Push Ups x10   Manual Therapy Other Manual Therapy: AROM: 0-110; PROM 0-115 PROM w/Grade II-III joint mobs to increase knee flexion with STM after.  x20 minutes Physical Therapy Assessment and Plan    Clinical Impression Statement: Continues to make gains with strength, but is limited by functional strength at end range.   Plan: Cont to address ROM deficits.   Goals    Problem List Patient Active Problem List  Diagnoses  . S/P ACL reconstruction  . Knee pain  . Knee stiffness       Justin Macdonald 04/23/2011, 11:16 AM

## 2011-04-26 ENCOUNTER — Ambulatory Visit (HOSPITAL_COMMUNITY)
Admission: RE | Admit: 2011-04-26 | Discharge: 2011-04-26 | Disposition: A | Discharge status: Home / Self Care | Payer: 59 | Source: Ambulatory Visit | Attending: Internal Medicine | Admitting: Internal Medicine

## 2011-04-26 NOTE — Progress Notes (Signed)
Physical Therapy Treatment Patient Details  Name: Justin Macdonald MRN: [id number] Date of Birth: Jun 08, 1970  Today's Date: 04/26/2011 Time: 8469-6295 Time Calculation (min): 38 min Charges: TE: 38 Visit#: 22  of 24   Re-eval: 05/03/11 Assessment Next MD Visit: 05/05/11  Subjective: Symptoms/Limitations Symptoms: Pt reports that he went to the [facility] on Friday afternoon and was pretty sore all over, but reports that his knee feels really good.  Pain Assessment Currently in Pain?: No/denies  Precautions/Restrictions  Precautions Precaution Comments: WBAT  Mobility (including Balance)       Exercise/Treatments Stretches Quad Stretch: 3 reps;60 seconds Aerobic Elliptical: 12 6.0 Isokinetic: Ramp Up and down 10x at 180, 150, 120, 90 Machines for Strengthening   Plyometrics   Standing Forward Lunges: Right;Left;Limitations;20 reps Forward Lunges Limitations: 8# in hands, alternating feet Functional Squat Limitations: Side walking squats 2 RT w/blue band and 8# weights Other Standing Knee Exercises: Heel/toe walking 3 RT, cueing for gait mechanics.  Seated   Supine   Sidelying   Prone  Hamstring Curl: 10 reps;Limitations Hamstring Curl Limitations: 10 sec isometric holds w/assistance    Physical Therapy Assessment and Plan PT Assessment and Plan PT Plan: Cont to progress.  Re-eval next Monday    Goals    Problem List Patient Active Problem List  Diagnoses  . S/P ACL reconstruction  . Knee pain  . Knee stiffness    PT - End of Session Activity Tolerance: Patient tolerated treatment well  Ladora Osterberg 04/26/2011, 10:20 AM

## 2011-04-28 ENCOUNTER — Ambulatory Visit (HOSPITAL_COMMUNITY)
Admission: RE | Admit: 2011-04-28 | Discharge: 2011-04-28 | Disposition: A | Discharge status: Home / Self Care | Payer: 59 | Source: Ambulatory Visit | Attending: Internal Medicine | Admitting: Internal Medicine

## 2011-04-28 NOTE — Progress Notes (Signed)
Physical Therapy Treatment Patient Details  Name: Justin Macdonald MRN: [id number] Date of Birth: Sep 14, 1970  Today's Date: 04/28/2011 Time: 4132-4401 Time Calculation (min): 45 min Charges: 30' TE Visit#: 23  of 24   Re-eval: 05/03/11    Subjective: Symptoms/Limitations Symptoms: No complaints today.  He reports that is continues to gain his strength. He reports his only complaint is not being able to run Pain Assessment Pain Score: 0-No pain  Exercise/Treatments Stretches Active Hamstring Stretch: Limitations Active Hamstring Stretch Limitations: 20x pumping motion Quad Stretch: 3 reps;60 seconds Aerobic Elliptical: 10' 6.0 Standing Wall Squat: Limitations Wall Squat Limitations: 3x30 sec Rebounder: 3x20 on foam w/red ball Other Standing Knee Exercises: Hurdle walking 12 and 6 inch 4 RT Seated Other Seated Knee Exercises: Stool Scoots RLE only 2 RT on carpet  Manual Therapy Other Manual Therapy: AROM: 110; PROM: 117  Physical Therapy Assessment and Plan PT Assessment and Plan Clinical Impression Statement: Continues to make gains with strength, flexibility and balance.  Has deficits with R knee AROM. PT Plan: Re-eval next visit    Goals    Problem List Patient Active Problem List  Diagnoses  . S/P ACL reconstruction  . Knee pain  . Knee stiffness    PT - End of Session Activity Tolerance: Patient tolerated treatment well  Neviah Braud 04/28/2011, 10:18 AM

## 2011-05-03 ENCOUNTER — Ambulatory Visit (HOSPITAL_COMMUNITY)
Admission: RE | Admit: 2011-05-03 | Discharge: 2011-05-03 | Disposition: A | Discharge status: Home / Self Care | Payer: 59 | Source: Ambulatory Visit | Attending: Internal Medicine | Admitting: Internal Medicine

## 2011-05-03 NOTE — Progress Notes (Signed)
Progress Note-Physical Therapy Treatment Patient Details  Name: Justin Macdonald MRN: [id number] Date of Birth: 02/22/1971  Today's Date: 05/03/2011 Time: 4098-1191 Time Calculation (min): 50 min Charges: 1 ROM, 1 MMT, 25' TE Visit#: 24  of 32   Re-eval: 06/02/11    Subjective: Symptoms/Limitations Symptoms: I'm going to see the doctor on Wedensday.  I am doing pretty well.  Pain Assessment Currently in Pain?: No/denies   Exercise/Treatments Stretches Quad Stretch: 3 reps;60 seconds Aerobic Elliptical: 10' 6.0 Standing Lunge Walking - Round Trips: 3 RT 10# in hands Rebounder: 3x20 on foam w/red ball Other Standing Knee Exercises: Heel/Toe Walking 3 RT  Other Standing Knee Exercises: Tall kneeling on EOB for demonstration and R knee half kneeling on EOB  Manual Therapy Other Manual Therapy: AROM: 0-110; PROM: 0-117 LEFS: 43/80  Physical Therapy Assessment and Plan PT Assessment and Plan Clinical Impression Statement: Mr. Montz has attended 24 OPPT visits and has met all his STG and is progressing toward his LTG.  He is currently attending the [facility] for workout activities including using the elliptical and bike for cardiovascular activites.  He is able to complete basic community mobility without difficulty.  However continues to be limitied by upper level work activities including kneeling, climibing ladders, increased swelling when standing for greater than an hour and dull/achy pain to the anterior knee with loaded knee extension activities.  He will continue to benefit from skilled OPPT in order to address current impairents to maximize function with return to work activities.  Rehab Potential: Good PT Frequency: Min 2X/week PT Duration: 4 weeks PT Treatment/Interventions: Gait training;Functional mobility training;Therapeutic exercise;Balance training;Neuromuscular re-education;Patient/family education (manual for ROM) PT Plan: Continue per protocol.  F/u after MD  visit.    Goals Home Exercise Program Pt will Perform Home Exercise Program: Independently PT Short Term Goals Time to Complete Short Term Goals: 4 weeks PT Short Term Goal 1: Pt will report pain less than 2/10 for 50% of his day. PT Short Term Goal 1 - Progress: Met PT Short Term Goal 2: Pt will ambulate w/WB precautions with appropriate gait mechanics. PT Short Term Goal 2 - Progress: Met PT Short Term Goal 3: Pt will improve his AROM 0-50 degrees. PT Short Term Goal 3 - Progress: Met PT Short Term Goal 4: Pt will improve his knee strength by 2 muscle grades to prepare for independent ambulation PT Short Term Goal 4 - Progress: Met PT Long Term Goals Time to Complete Long Term Goals: 12 weeks PT Long Term Goal 1: Pt will have LLE strength and power WNL in order to independently ascend and descend 3 flights of stairs w/reciprocal gait pattern without increase in pain. PT Long Term Goal 1 - Progress: Met PT Long Term Goal 2: Pt will L knee strength to WNL ambulate independently greater than 2 hours in order to participate in community activities. PT Long Term Goal 2 - Progress: Met Long Term Goal 3: Pt will improve her LEFS by 20 points for improved quality of life. Long Term Goal 4: Pt will improve L knee AROM to WNL in order to ambulate with appropriate gait biomechanics in order to decrease risk of secondary impairments.  PT Long Term Goal 5: Pt will improve LE power in order to demonstrate 5 half kneel to stand without an increase in pain.   Problem List Patient Active Problem List  Diagnoses  . S/P ACL reconstruction  . Knee pain  . Knee stiffness    PT - End  of Session Activity Tolerance: Patient tolerated treatment well  Dene Landsberg 05/03/2011, 9:56 AM

## 2011-05-05 ENCOUNTER — Telehealth (HOSPITAL_COMMUNITY): Payer: Self-pay

## 2011-05-05 ENCOUNTER — Ambulatory Visit (HOSPITAL_COMMUNITY): Payer: 59

## 2011-05-07 ENCOUNTER — Ambulatory Visit (HOSPITAL_COMMUNITY)
Admission: RE | Admit: 2011-05-07 | Discharge: 2011-05-07 | Disposition: A | Discharge status: Home / Self Care | Payer: 59 | Source: Ambulatory Visit | Attending: Internal Medicine | Admitting: Internal Medicine

## 2011-05-07 NOTE — Progress Notes (Signed)
Physical Therapy Treatment Patient Details  Name: ROMEO ZIELINSKI MRN: [id number] Date of Birth: [birth year]  Today's Date: 05/07/2011 Time: 8295-6213 Time Calculation (min): 50 min Visit#: 25  of 32   Re-eval: 06/02/11  Charge: therex 40 min  Subjective: Symptoms/Limitations Symptoms: No pain today, feeling good.  Pt has been moved down to 1x per week per PT.  Annett Fabian, PT began session from 8:25-8:45 Pain Assessment Currently in Pain?: No/denies  Objective:     Exercise/Treatments Stretches Quad Stretch: 3 reps;60 seconds Aerobic Elliptical: 10' 6.0 Isokinetic: Ramp Up and down 10x at 150, 120, 90, 75 Machines for Strengthening Cybex Knee Extension: 2.5 PL with R LE eccentric lowering 2x 10 Standing Wall Squat: Limitations Wall Squat Limitations: wall squat 2 min with throw/catch  Lunge Walking - Round Trips: 3 RT 10# in hands SLS with Vectors: 3x 30" on foam Rebounder: 3x20 on foam w/yellow ball Other Standing Knee Exercises: Heel/Toe Walking 3 RT  Other Standing Knee Exercises: Tall kneeling on foam with transition to half kneeling 5x 30" holds    Physical Therapy Assessment and Plan PT Assessment and Plan Clinical Impression Statement: PT make great progress towards goals, added vector stance on foam for increase knee strength, stability and balance, pt required L LE toe touches for LOB episdoes.   PT Plan: Begin BOSU step up, step down, lunges and wall squats next session.    Goals    Problem List Patient Active Problem List  Diagnoses  . S/P ACL reconstruction  . Knee pain  . Knee stiffness    PT - End of Session Activity Tolerance: Patient tolerated treatment well General Behavior During Session: [facility] for tasks performed Cognition: [facility] for tasks performed  [name] 05/07/2011, 9:25 AM

## 2011-05-10 ENCOUNTER — Ambulatory Visit (HOSPITAL_COMMUNITY)
Admission: RE | Admit: 2011-05-10 | Discharge: 2011-05-10 | Disposition: A | Discharge status: Home / Self Care | Payer: 59 | Source: Ambulatory Visit | Attending: Specialist | Admitting: Specialist

## 2011-05-10 DIAGNOSIS — IMO0001 Reserved for inherently not codable concepts without codable children: Secondary | ICD-10-CM | POA: Insufficient documentation

## 2011-05-10 DIAGNOSIS — R262 Difficulty in walking, not elsewhere classified: Secondary | ICD-10-CM | POA: Insufficient documentation

## 2011-05-10 DIAGNOSIS — M6281 Muscle weakness (generalized): Secondary | ICD-10-CM | POA: Insufficient documentation

## 2011-05-10 DIAGNOSIS — M25669 Stiffness of unspecified knee, not elsewhere classified: Secondary | ICD-10-CM | POA: Insufficient documentation

## 2011-05-10 DIAGNOSIS — M25569 Pain in unspecified knee: Secondary | ICD-10-CM | POA: Insufficient documentation

## 2011-05-10 NOTE — Progress Notes (Signed)
Physical Therapy Treatment Patient Details  Name: Justin Macdonald MRN: [id number] Date of Birth: 1970-09-29  Today's Date: 05/10/2011 Time: 7829-5621 Time Calculation (min): 55 min Visit#: 26  of 32   Re-eval: 06/02/11 Charges:  63' therex   Subjective: Symptoms/Limitations Symptoms: Pt. states he's doing well today.  Therapy session began by Nicholes Rough, PT technician. Pain Assessment Currently in Pain?: No/denies      Exercise/Treatments Stretches Quad Stretch: 3 reps;60 seconds Aerobic Elliptical: 10' 6.0 Isokinetic: Ramp Up and down 10x at 150, 120, 90, 75 Machines for Strengthening Cybex Knee Extension: 2.5 PL with R LE eccentric lowering 2x 10 Plyometrics Other Plyometric Exercises: BOSU forward step ups 10 reps  Other Plyometric Exercises: BOSU lateral step ups 10 reps Standing Wall Squat: Limitations Wall Squat Limitations: wall squat 2 min with throw/catch  Lunge Walking - Round Trips: 3 RT 10# in hands SLS with Vectors: 3x 30" on foam Rebounder: 3x20 on foam w/yellow ball Other Standing Knee Exercises: Heel/Toe Walking 3 RT  Other Standing Knee Exercises: Tall kneeling on foam with transition to half kneeling 5x 30" holds    Physical Therapy Assessment and Plan PT Assessment and Plan Clinical Impression Statement: Added step ups with BOSU ball forward and lateral.  Step downs posed too much knee strain.  Pt. with less discomfort with knee extension today. PT Plan: Refer to protocol regarding plyometrics for next visit (static hops, etc.).  Progress per protocol.     Problem List Patient Active Problem List  Diagnoses  . S/P ACL reconstruction  . Knee pain  . Knee stiffness    PT - End of Session Activity Tolerance: Patient tolerated treatment well General Behavior During Session: [facility] for tasks performed Cognition: [facility] for tasks performed  Emeline Gins B 05/10/2011, 10:51 AM

## 2011-05-12 ENCOUNTER — Ambulatory Visit (HOSPITAL_COMMUNITY): Payer: 59 | Admitting: *Deleted

## 2011-05-14 ENCOUNTER — Ambulatory Visit (HOSPITAL_COMMUNITY): Payer: 59 | Admitting: Physical Therapy

## 2011-05-17 ENCOUNTER — Ambulatory Visit (HOSPITAL_COMMUNITY)
Admission: RE | Admit: 2011-05-17 | Discharge: 2011-05-17 | Disposition: A | Discharge status: Home / Self Care | Payer: 59 | Source: Ambulatory Visit | Attending: Internal Medicine | Admitting: Internal Medicine

## 2011-05-17 NOTE — Progress Notes (Signed)
Physical Therapy Treatment Patient Details  Name: Justin Macdonald MRN: [id number] Date of Birth: 1971/04/30  Today's Date: 05/17/2011 Time: 4098-1191 Time Calculation (min): 51 min Visit#: 27  of 32   Re-eval: 06/02/11 Charges: Therex x 39'  Subjective: Symptoms/Limitations Symptoms: Pt states that he is pain free. Pain Assessment Currently in Pain?: No/denies Pain Score: 0-No pain   Exercise/Treatments Aerobic Elliptical: 10' 6.0 Isokinetic: Ramp Up and down 10x at 150, 120, 90, 75 Machines for Strengthening Cybex Knee Extension: 2.5 PL with R LE eccentric lowering 2x 10 Plyometrics Other Plyometric Exercises: BOSU forward step ups 15 reps  Other Plyometric Exercises: BOSU lateral step ups 15 reps Standing Lunge Walking - Round Trips: Side squats 3 RT 10# in each hand SLS with Vectors: 3x 30" on foam Rebounder: 3x20 on foam w/yellow ball Other Standing Knee Exercises: Heel/Toe Walking 1 RT  (D/C) Other Standing Knee Exercises: Tall kneeling on foam with transition to half kneeling 5x 30" holds  Physical Therapy Assessment and Plan PT Assessment and Plan Clinical Impression Statement: Pt with some discomfort with knee ext cybex this session. Pt reports no pain with biodex. Pt displays good stability with all single leg activities. Pt reports no increase in pain at end of session. PT Plan: Continue to progress per protocol.     Problem List Patient Active Problem List  Diagnoses  . S/P ACL reconstruction  . Knee pain  . Knee stiffness    PT - End of Session Activity Tolerance: Patient tolerated treatment well General Behavior During Session: [facility] for tasks performed Cognition: [facility] for tasks performed  Antonieta Iba 05/17/2011, 10:30 AM

## 2011-05-25 ENCOUNTER — Ambulatory Visit (HOSPITAL_COMMUNITY)
Admission: RE | Admit: 2011-05-25 | Discharge: 2011-05-25 | Disposition: A | Discharge status: Home / Self Care | Payer: 59 | Source: Ambulatory Visit | Attending: Internal Medicine | Admitting: Internal Medicine

## 2011-05-25 NOTE — Progress Notes (Signed)
Physical Therapy Treatment Patient Details  Name: Justin Macdonald MRN: [id number] Date of Birth: 06-01-1971  Today's Date: 05/25/2011 Time: 0801-0850 Time Calculation (min): 49 min Visit#: 28  of 32   Re-eval: 06/02/11  Charge: therex 38 min  Subjective: Symptoms/Limitations Symptoms: Pain free today, tired worked my legs at the gym last night and woke up at 3:00 this morning. Pain Assessment Currently in Pain?: No/denies  Objective:   Exercise/Treatments Aerobic Elliptical: 10' 6.0 Isokinetic: Ramp Up and down 10x at 120, 90, 75 Machines for Strengthening Cybex Knee Extension: 2.5 PL with R LE eccentric lowering 15 reps Plyometrics Other Plyometric Exercises: BOSU forward step ups 2x 10 Other Plyometric Exercises: BOSU lateral step ups 2x 10 Standing Wall Squat: Limitations;5 sets;10 seconds Wall Squat Limitations: wall squats on BOSU Lunge Walking - Round Trips: Side squats 3 RT 10# in each hand SLS with Vectors: 3x 30" on foam Other Standing Knee Exercises: Tall kneeling on foam with transition to half kneeling 5x 30" holds      Physical Therapy Assessment and Plan PT Assessment and Plan Clinical Impression Statement: Added wall squats on BOSU for increased knee stability, pt with visible quad fatigue.  Pt stated some discomfort with knee extension cybex.  No pain with any other therex activity. PT Plan: Continue to progress per protocol    Goals    Problem List Patient Active Problem List  Diagnoses  . S/P ACL reconstruction  . Knee pain  . Knee stiffness    PT - End of Session Activity Tolerance: Patient tolerated treatment well General Behavior During Session: [facility] for tasks performed Cognition: [facility] for tasks performed  [name] 05/25/2011, 8:51 AM

## 2011-05-31 ENCOUNTER — Ambulatory Visit (HOSPITAL_COMMUNITY): Payer: 59 | Admitting: Physical Therapy

## 2011-06-03 ENCOUNTER — Ambulatory Visit (HOSPITAL_COMMUNITY): Payer: 59 | Admitting: *Deleted

## 2011-06-07 ENCOUNTER — Ambulatory Visit (HOSPITAL_COMMUNITY)
Admission: RE | Admit: 2011-06-07 | Discharge: 2011-06-07 | Disposition: A | Discharge status: Home / Self Care | Payer: 59 | Source: Ambulatory Visit | Attending: Internal Medicine | Admitting: Internal Medicine

## 2011-06-07 NOTE — Progress Notes (Addendum)
Physical Therapy Progress Note  Patient Details  Name: Justin Macdonald MRN: [id number] Date of Birth: February 10, 1971  Today's Date: 06/07/2011 Time: 4098-1191 Time Calculation (min): 40 min Visit#: 29  of 32  in 13 weeks Re-eval: 06/07/11 Next MD Visit: 06/16/2011 Charges: therex 25', ROM test   Subjective Symptoms/Limitations Symptoms: Pt. states he took last week off at the gym.  Pt. reports he has no pain today. Pain Assessment Currently in Pain?: No/denies  Precautions/Restrictions  Restrictions Weight Bearing Restrictions: No Other Position/Activity Restrictions: No running  Objective: RLE AROM (degrees) Right Knee Extension 0-130: 0  Right Knee Flexion 0-140: 130  RLE PROM (degrees) Right Knee Extension 0-130: 0  Right Knee Flexion 0-140: 135   R knee strength:  Manually 5/5, One rep max R: 45# (L: 132.5#)  Exercise/Treatments Aerobic Elliptical: 10' 6.0 Machines for Strengthening Cybex Knee Extension: 2.5 PL with R LE eccentric lowering 20 reps Standing Lunge Walking - Round Trips: Side squats 3 RT 10# in each hand SLS with Vectors: 3x 30" on foam    Physical Therapy Assessment and Plan PT Assessment and Plan Clinical Impression Statement: Pt. met all goals, except R quad 1RM is 34% of L quad.  Pt. is having no other functional difficulties and is able to complete all basic activities without pain.  Pt. is currently attending the gym to work on remaining strength deficits.  Pt. is independent with advanced HEP. PT Plan: Recommend to therapist pt. be discharged to advanced HEP.  Pt. is agreeable to POC.    Goals Home Exercise Program Pt will Perform Home Exercise Program: Independently PT Goal: Perform Home Exercise Program - Progress: Met PT Short Term Goals Time to Complete Short Term Goals: 4 weeks PT Short Term Goal 1: Pt will report pain less than 2/10 for 50% of his day. PT Short Term Goal 1 - Progress: Met PT Short Term Goal 2: Pt will ambulate w/WB  precautions with appropriate gait mechanics. PT Short Term Goal 2 - Progress: Met PT Short Term Goal 3: Pt will improve his AROM 0-50 degrees. PT Short Term Goal 3 - Progress: Met PT Short Term Goal 4: Pt will improve his knee strength by 2 muscle grades to prepare for independent ambulation PT Short Term Goal 4 - Progress: Met PT Long Term Goals Time to Complete Long Term Goals: 12 weeks PT Long Term Goal 1: Pt will have LLE strength and power WNL in order to independently ascend and descend 3 flights of stairs w/reciprocal gait pattern without increase in pain. PT Long Term Goal 1 - Progress: Met PT Long Term Goal 2: Pt will L knee strength to WNL ambulate independently greater than 2 hours in order to participate in community activities. PT Long Term Goal 2 - Progress: Met Long Term Goal 3: Pt will improve her LEFS by 20 points for improved quality of life. Long Term Goal 3 Progress: Other (comment) (Not assessed) Long Term Goal 4: Pt will improve L knee AROM to WNL in order to ambulate with appropriate gait biomechanics in order to decrease risk of secondary impairments.  Long Term Goal 4 Progress: Met PT Long Term Goal 5: Pt will improve LE power in order to demonstrate 5 half kneel to stand without an increase in pain.  Long Term Goal 5 Progress: Met  Problem List Patient Active Problem List  Diagnoses  . S/P ACL reconstruction  . Knee pain  . Knee stiffness    PT - End of Session Activity  Tolerance: Patient tolerated treatment well General Behavior During Session: [facility] for tasks performed Cognition: [facility] for tasks performed   Justin Macdonald, PTA 06/07/2011, 8:53 AM

## 2011-06-14 ENCOUNTER — Ambulatory Visit (HOSPITAL_COMMUNITY): Payer: 59

## 2011-06-21 ENCOUNTER — Ambulatory Visit (HOSPITAL_COMMUNITY): Payer: 59

## 2011-06-28 ENCOUNTER — Ambulatory Visit (HOSPITAL_COMMUNITY): Payer: 59

## 2011-07-05 ENCOUNTER — Ambulatory Visit (HOSPITAL_COMMUNITY): Payer: 59 | Admitting: Physical Therapy

## 2014-02-18 ENCOUNTER — Ambulatory Visit: Payer: 59

## 2014-02-18 ENCOUNTER — Ambulatory Visit (INDEPENDENT_AMBULATORY_CARE_PROVIDER_SITE_OTHER): Payer: 59

## 2014-02-18 ENCOUNTER — Ambulatory Visit (INDEPENDENT_AMBULATORY_CARE_PROVIDER_SITE_OTHER): Payer: 59 | Admitting: Orthopedic Surgery

## 2014-02-18 VITALS — BP 136/94 | Ht 67.0 in | Wt 159.2 lb

## 2014-02-18 DIAGNOSIS — M25569 Pain in unspecified knee: Secondary | ICD-10-CM

## 2014-02-18 DIAGNOSIS — IMO0002 Reserved for concepts with insufficient information to code with codable children: Secondary | ICD-10-CM

## 2014-02-18 DIAGNOSIS — S83242D Other tear of medial meniscus, current injury, left knee, subsequent encounter: Secondary | ICD-10-CM

## 2014-02-18 DIAGNOSIS — Z5189 Encounter for other specified aftercare: Secondary | ICD-10-CM

## 2014-02-18 DIAGNOSIS — M25562 Pain in left knee: Secondary | ICD-10-CM

## 2014-02-18 NOTE — Patient Instructions (Signed)
We will order MRI for you

## 2014-02-18 NOTE — Progress Notes (Signed)
Chief Complaint  Patient presents with  . Knee Pain    left knee pain and swelling x 3 months, no known injury    [facility]  Problem pain left knee Present 3 months No injury Symptoms: Pain, swelling, stiffness. Pain described as dull Pain described as constant Pain 6/10  Associated with fluid and pain with walking on the lateral side of the knee with occasional radiation up into the hip and down into the foot but only when they fluid is present in the knee.  Previous treatment to date aspiration of fluid  Review of systems sinus problems anxiety seasonal allergy joint pain with stiffness and swelling  Medical history negative  Surgery history 2000 tendon anterior cruciate ligament reconstruction and then had a repeat anterior cruciate ligament reconstruction with medial collateral ligament repair by Dr. Theda Sers  Current medications Lexapro and sinus tablets  No medication allergies  Family history diabetes alcoholism hypertension heart disease stroke arthritis and thyroid disease  Social history does not smoke  Some occasional use of alcohol on weekends and no street drug use  Left Knee Exam  Swelling: None Effusion: Yes  Tenderness  The patient is experiencing tenderness in the medial joint line.  Range of Motion  Normal left knee ROM Extension: Normal Flexion:     Normal  Muscle Strength  Normal left knee strength  Tests  McMurrays:  Medial - Positive      Lateral - Negative Lachman:  Anterior - Negative    Posterior - Negative Varus:  Negative Valgus: Negative Pivot Shift: Negative Patellar Apprehension: No  Comments:  Right knee previous anterior cruciate ligament reconstruction and open medial collateral ligament repair shows trace plus Lachman negative pivot shift 4 range of motion normal strength stability and alignment with no effusion  Distal pulses are intact lymph nodes are negative sensation is normal pathologic reflexes none deep tendon  reflexes equal coordination balance normal  Appearance normal oriented x3 mood and affect normal gait and station normal  Upper extremity show no contracture subluxation atrophy tremor or skin lesions    I ordered knee x-rays do show some mild medial joint space narrowing without effusion and no significant arthritic changes  Impression torn medial meniscus  Recommend MRI left knee to diagnose medial meniscus tear and plan surgical intervention.

## 2014-03-07 ENCOUNTER — Ambulatory Visit: Payer: 59 | Admitting: Orthopedic Surgery

## 2014-06-17 ENCOUNTER — Ambulatory Visit (INDEPENDENT_AMBULATORY_CARE_PROVIDER_SITE_OTHER): Payer: BLUE CROSS/BLUE SHIELD | Admitting: Sports Medicine

## 2014-06-17 ENCOUNTER — Encounter: Payer: Self-pay | Admitting: Sports Medicine

## 2014-06-17 VITALS — BP 167/90 | HR 50 | Ht 67.0 in | Wt 165.0 lb

## 2014-06-17 DIAGNOSIS — M25562 Pain in left knee: Secondary | ICD-10-CM | POA: Diagnosis not present

## 2014-06-17 MED ORDER — METHYLPREDNISOLONE ACETATE 40 MG/ML IJ SUSP
40.0000 mg | Freq: Once | INTRAMUSCULAR | Status: AC
  Administered 2014-06-17: 40 mg via INTRA_ARTICULAR

## 2014-06-17 NOTE — Assessment & Plan Note (Addendum)
Left Knee Pain and swelling likely due to meniscal tear versus possible anterior cruciate ligament tear  X-ray from September 2015 demonstrate mild osteoarthritis of left knee.   Plan:  1) Recommended patient to continue using left knee compression during and for 1 hour after exercise.  2) Continue Diclofenac or aleve as needed for swelling/pain 3) MRI to evaluate for meniscal / ACL tear 4) inject the patient's left knee with cortisone 5) Phone follow up regarding results of MRI (surgery vs. Non-surgical treatment)  Consent obtained and verified. Time-out conducted. Noted no overlying erythema, induration, or other signs of local infection. Skin prepped in a sterile fashion. Topical analgesic spray: Ethyl chloride. Joint: left knee; anterior lateral approach Needle: 22g 1.5 inch Completed without difficulty. Meds: 3cc 1% xylocaine, 1cc (40mg ) depomedrol  Advised to call if fevers/chills, erythema, induration, drainage, or persistent bleeding.

## 2014-06-17 NOTE — Progress Notes (Signed)
   Subjective:    Patient ID: Justin Macdonald, male    DOB: 07/03/1970, 44 y.o.   MRN: [id number]  HPI Justin Macdonald is a 44 year old gentlemen with PMH of 2 Right ACL reconstructions (one with MCL repair both in 2011) who presents for left knee pain. Justin Macdonald was last seen for his knee pain in September 2015 by Dr. Aline Brochure. At that time, an MRI was ordered but not obtained due to insurance denial. Justin Macdonald's knee pain slowly improved and he was able to participate in cross fit without difficulty until last Thursday. While doing overhead presses at crossfit, Justin Macdonald had an acute event of left knee swelling and laxity. He felt like his knee was catching and going to give out on him. His knee also became dramatically swollen. He stopped participating in his workout. He currently denies pain, but has been taking diclofenac to improve the swelling.    Of note, he does not run anymore due to his R knee. He does circuit training at cross fit.    Review of Systems  Gen: No weight changes. No chills.  Chest: No SOB. No chest pain.       Objective:   Physical Exam   Well nourished. Well developed. No acute distress.  BP 167/90 mmHg  Pulse 50  Ht 5\' 7"  (1.702 m)  Wt 165 lb (74.844 kg)  BMI 25.84 kg/m2  Right Knee: No swelling or redness noted. Well-healed surgical incision consistent with his prior anterior cruciate ligament reconstructions. Non-tender to palpation. Normal knee extension and flexion ROM.  Negative lachman's. Negative McMurray. Negative anterior drawer. 5/5 knee extension and knee flexion strength.  Left Knee: Trace effusion present. Non-tender to palpation along joint line. Normal knee extension and flexion ROM. 1+/2+ Lachman's. Negative anterior drawer. Equivical Mcmurrays test. Negative thessaly's test.      Left Knee X-ray 02/18/14 Impression: mild osteoarthritis      Assessment & Plan:      This note originally written by Jolinda Croak, MS4 [facility]. Note edited and reviewed by Dr. Lilia Argue.

## 2014-06-19 ENCOUNTER — Ambulatory Visit
Admission: RE | Admit: 2014-06-19 | Discharge: 2014-06-19 | Disposition: A | Discharge status: Home / Self Care | Payer: BLUE CROSS/BLUE SHIELD | Source: Ambulatory Visit | Attending: Sports Medicine | Admitting: Sports Medicine

## 2014-06-19 DIAGNOSIS — M25562 Pain in left knee: Secondary | ICD-10-CM

## 2014-06-20 ENCOUNTER — Telehealth: Payer: Self-pay | Admitting: Sports Medicine

## 2014-06-20 ENCOUNTER — Other Ambulatory Visit: Payer: BLUE CROSS/BLUE SHIELD

## 2014-06-20 NOTE — Telephone Encounter (Signed)
I spoke with the patient on the phone today after reviewing the MRI of his left knee. Patient has a completely torn anterior cruciate ligament. There is no concomitant bone bruising to suggest that this is an acute injury. He also has a rather large lateral meniscal tear and a moderate-sized effusion. 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 Based on this constellation of findings I recommend surgical consultation with Dr. Noemi Chapel to discuss meniscectomy and possible anterior cruciate ligament reconstruction. Further treatment will be per the discretion of Dr. Noemi Chapel in the patient will follow-up with me when necessary.

## 2014-06-21 ENCOUNTER — Telehealth: Payer: Self-pay | Admitting: *Deleted

## 2014-06-21 NOTE — Telephone Encounter (Signed)
-----   Message from Thurman Coyer, DO sent at 06/20/2014  9:28 AM EST ----- Regarding: Dr Noemi Chapel referral Please refer to Dr. Noemi Chapel for meniscectomy and possible anterior cruciate ligament reconstruction  ----- Message -----    From: Rad Results In Interface    Sent: 06/19/2014   2:16 PM      To: Thurman Coyer, DO

## 2014-06-21 NOTE — Telephone Encounter (Signed)
Called pt and made aware of appt with Dr. Noemi Chapel, on 06/24/14 @2 :15

## 2014-09-06 ENCOUNTER — Ambulatory Visit (HOSPITAL_COMMUNITY): Payer: BLUE CROSS/BLUE SHIELD | Attending: Orthopedic Surgery | Admitting: Physical Therapy

## 2014-09-06 ENCOUNTER — Encounter (HOSPITAL_COMMUNITY): Payer: Self-pay | Admitting: Physical Therapy

## 2014-09-06 DIAGNOSIS — Z4789 Encounter for other orthopedic aftercare: Secondary | ICD-10-CM | POA: Insufficient documentation

## 2014-09-06 DIAGNOSIS — Z9889 Other specified postprocedural states: Secondary | ICD-10-CM | POA: Diagnosis not present

## 2014-09-06 DIAGNOSIS — Z7409 Other reduced mobility: Secondary | ICD-10-CM | POA: Insufficient documentation

## 2014-09-06 DIAGNOSIS — R269 Unspecified abnormalities of gait and mobility: Secondary | ICD-10-CM | POA: Insufficient documentation

## 2014-09-06 DIAGNOSIS — R531 Weakness: Secondary | ICD-10-CM

## 2014-09-06 DIAGNOSIS — M25662 Stiffness of left knee, not elsewhere classified: Secondary | ICD-10-CM | POA: Insufficient documentation

## 2014-09-06 DIAGNOSIS — R29898 Other symptoms and signs involving the musculoskeletal system: Secondary | ICD-10-CM | POA: Diagnosis not present

## 2014-09-06 DIAGNOSIS — M24662 Ankylosis, left knee: Secondary | ICD-10-CM

## 2014-09-06 NOTE — Therapy (Signed)
[place], [place], [address] Phone: [phone]   Fax:  [phone]  Physical Therapy Evaluation  Patient Details  Name: Justin Macdonald MRN: [id number] Date of Birth: 12-24-1970 Referring Provider:  Elsie Saas, MD  Encounter Date: 09/06/2014      PT End of Session - 09/06/14 1222    Visit Number 1   Number of Visits 27   Date for PT Re-Evaluation 10/07/14   Authorization Type BCBS   Authorization Time Period 09/06/14 to 11/06/14   Authorization - Visit Number 1   Authorization - Number of Visits 30   Activity Tolerance Patient tolerated treatment well   Behavior During Therapy [facility] for tasks assessed/performed      No past medical history on file.  No past surgical history on file.  There were no vitals filed for this visit.  Visit Diagnosis:  S/P ACL repair - Plan: PT plan of care cert/re-cert  Decreased range of motion of knee, left - Plan: PT plan of care cert/re-cert  Abnormality of gait - Plan: PT plan of care cert/re-cert  Decreased strength involving knee joint - Plan: PT plan of care cert/re-cert  Decreased strength, endurance, and mobility - Plan: PT plan of care cert/re-cert      Subjective Assessment - 09/06/14 1107    Symptoms States knee feels pretty good, usually feels "mushy"- feels weak, feels different with weight bearing and staes that he feels like there's still fluid in there.    Pertinent History Patient has had surgery on his opposite knee in the past for ACL repair. When he was [place] his other knee, he did something to his L knee and it stayed swollen for years; about 6 months ago, knee began falling out of joint. ACL, mensiscus, and bone repair done  on 08/21/14.    How long can you sit comfortably? No limits   How long can you stand comfortably? No limits   How long can you walk comfortably? Furthest distance 543ft approximately   Patient Stated Goals get back to  everything   Currently in Pain? No/denies            [facility] PT Assessment - 09/06/14 0001    Assessment   Medical Diagnosis s/p L ACL repair with meniscus repair and repair of bone    Onset Date 08/21/14   Precautions   Precautions Knee;Other (comment)   Precaution Comments See Acc[facility]erated Rehab for ACL-PTG Reconstruction Protocol   Required Braces or Orthoses Knee Immobilizer - Left   Restrictions   Weight Bearing Restrictions Yes   LLE Weight Bearing Weight bearing as tolerated   Balance Screen   Has the patient fallen in the past 6 months No   Has the patient had a decrease in activity level because of a fear of falling?  Yes   Is the patient reluctant to leave their home because of a fear of falling?  No   Prior Function   Level of Independence Independent with basic ADLs;Independent with gait;Independent with transfers   Vocation Full time employment   Vocation Requirements Maintenance at the [employer]   Observation/Other Assessments   Focus on Therapeutic Outcomes (FOTO)  66%   AROM   Right Hip External Rotation  45   Right Hip Internal Rotation  30   Left Hip External Rotation  40   Left Hip Internal Rotation  30   Left Knee Extension 3  passive  Left Knee Flexion 94  passive   Strength   Right Hip Flexion 4+/5   Right Hip Extension 5/5   Right Hip ABduction 5/5   Left Hip Flexion 4+/5   Left Hip Extension 4/5   Left Hip ABduction 4/5   Right Knee Flexion 4+/5   Right Knee Extension 4+/5   Left Knee Flexion 3+/5  approximate   Left Knee Extension 3/5  based on ability to perform quad sets   Right Ankle Dorsiflexion 5/5   Left Ankle Dorsiflexion 5/5   Ambulation/Gait   Gait Comments Continues to ambulate with brace on; hip ER, slightly reduced step length R, reduced pelvic motion during gait noted, reduced TKE L                    [facility] Adult PT Treatment/Exercise - 09/06/14 0001    Knee/Hip Exercises: Standing   Functional Squat  1 set;10 reps   Functional Squat Limitations 0-40 degrees    Rocker Board Limitations x20 with close monitoring of range of motion to remain within safe limits    Other Standing Knee Exercises Standing weight shifts on floor 1x20 laterally   Knee/Hip Exercises: Seated   Long Arc Quad Left;1 set;10 reps   Long Arc Quad Weight 0 lbs.   Long Arc Quad Limitations 90-40 degrees per MD protocol    Knee/Hip Exercises: Supine   Quad Sets Left;1 set;10 reps   Straight Leg Raises 10 reps;1 set   Straight Leg Raise with External Rotation Left;1 set;10 reps   Manual Therapy   Manual Therapy Joint mobilization   Joint Mobilization Patellar mobilizations lateral and proximal to distal                 PT Education - 09/06/14 1222    Education provided Yes   Education Details Education provided regarding current precautions for knee as well as importance of adhering to precautions to avoid damage to repair   Person(s) Educated Patient   Methods Explanation;Demonstration   Comprehension Verbalized understanding          PT Short Term Goals - 09/06/14 1223    PT SHORT TERM GOAL #1   Title Patient will demonstrate muscle strength of at least 4-/5 in L leg in order to improve knee stability and reduce discomfort during functional tasks   Time 6   Period Weeks   Status New   PT SHORT TERM GOAL #2   Title Patient will demonstrate R knee flexion of at least 115 degrees with minimal discomfort in order to enhance functional activity performance   Time 6   Period Weeks   Status New   PT SHORT TERM GOAL #3   Title Patient will demonstrate the ability to ambulate unlimited distances with no AD, equal step lengths, minimal fatigue, and good mechanics throughout    Time 6   Period Weeks   Status New   PT SHORT TERM GOAL #4   Title Patient will demonstrate the ability to safely and efficiently perform appropriate exercises regimens at home, within ACL precautions, in order to maintain  personal fitness and functional task performance   Time 6   Period Weeks   Status New   PT SHORT TERM GOAL #5   Title Patient will demonstrate the ability to safely and efficiently perform appropriate HEP with independence   Time 6   Period Weeks   Status New           PT Long Term  Goals - 09/06/14 1224    PT LONG TERM GOAL #1   Title Patient to be independent in advanced HEP    Time 12   Period Weeks   Status New   PT LONG TERM GOAL #2   Title Patient will demonstrate active L knee flexion of at least 125 degrees and extension of 0 degrees with pain 0/10   Time 12   Period Weeks   Status New   PT LONG TERM GOAL #3   Title Patient will demonstrate the ability to perform full depth squat to floor with return to stand with good mechanics and pain 0/10   Time 12   Period Weeks   Status New   PT LONG TERM GOAL #4   Title Patient will demonstrate the ability to perform bilateral leg hop with distance at least 6 inches and pain 0/10, good mechanics without pain and adequate knee stability present   Time 12   Period Weeks   Status New   PT LONG TERM GOAL #5   Title Patient will demonstrate bilateral lower extremity strength 5/5 in all tested muscle groups in order to facilitate return to functional tasks and sports performance   Time 12   Period Weeks   Status New               Plan - 09/06/14 1222    Clinical Impression Statement Patient presents with gait deviations, reduced strength and range of motion R knee, reduced activity tolerance, edema, difficulty with stair training, and reduced mobility and endurance at this time due to recent ACL repair. Adhered to accelerated rehabilitation following ACL-PTG precautions at this time, as indicated by MD. Performed functional exercises with PT closely monitoring range of motion to maintain safety of knee. Patient does state that he does feel like he needs to be monitored during activity as he tends to push himself too far,  slightly concerned about remaining within precautions. Patient will benefit from skilled PT services in order to address these impairments and to assist him in reaching optimal level of function.   Pt will benefit from skilled therapeutic intervention in order to improve on the following deficits Abnormal gait;Decreased coordination;Decreased range of motion;Difficulty walking;Impaired flexibility;Decreased endurance;Increased edema;Decreased activity tolerance;Decreased knowledge of precautions;Decreased balance;Decreased mobility;Decreased strength   Rehab Potential Good   PT Frequency Other (comment)  3x/week for 3 weeks, 2x/week for next 9 weeks   PT Duration 12 weeks  3x/week for first 3 weeks, 2x/week for next 9 weeks   PT Treatment/Interventions ADLs/Self Care Home Management;Gait training;Neuromuscular re-education;Stair training;Functional mobility training;Patient/family education;Passive range of motion;Cryotherapy;Therapeutic activities;Electrical Stimulation;Therapeutic exercise;Manual techniques;Balance training   PT Next Visit Plan Assign HEP including SLRs, quad sets, mini-squats with in safe range (no more than 40 degrees flexion), weight shifting, bridges with non-surgical leg (surgical leg straight). Continue functional stretching and strengthening.    PT Home Exercise Plan To be assigned 2nd session   Consulted and Agree with Plan of Care Patient         Problem List Patient Active Problem List   Diagnosis Date Noted  . S/P ACL reconstruction 03/02/2011  . Knee pain 03/02/2011  . Knee stiffness 03/02/2011    Deniece Ree PT, DPT [place] [address] [place], [place], [address] Phone: [phone]   Fax:  [phone]

## 2014-09-10 ENCOUNTER — Ambulatory Visit (HOSPITAL_COMMUNITY): Payer: BLUE CROSS/BLUE SHIELD | Admitting: Physical Therapy

## 2014-09-10 DIAGNOSIS — Z4789 Encounter for other orthopedic aftercare: Secondary | ICD-10-CM | POA: Diagnosis not present

## 2014-09-10 DIAGNOSIS — R531 Weakness: Secondary | ICD-10-CM

## 2014-09-10 DIAGNOSIS — Z7409 Other reduced mobility: Secondary | ICD-10-CM

## 2014-09-10 DIAGNOSIS — Z9889 Other specified postprocedural states: Secondary | ICD-10-CM

## 2014-09-10 DIAGNOSIS — M24662 Ankylosis, left knee: Secondary | ICD-10-CM

## 2014-09-10 DIAGNOSIS — R29898 Other symptoms and signs involving the musculoskeletal system: Secondary | ICD-10-CM

## 2014-09-10 DIAGNOSIS — R269 Unspecified abnormalities of gait and mobility: Secondary | ICD-10-CM

## 2014-09-10 NOTE — Therapy (Signed)
[place], [place], [address] Phone: [phone]   Fax:  [phone]  Physical Therapy Treatment  Patient Details  Name: Justin Macdonald MRN: [id number] Date of Birth: 07-Jul-1970 Referring Provider:  Elsie Saas, MD  Encounter Date: 09/10/2014      PT End of Session - 09/10/14 1253    Visit Number 2   Number of Visits 27   Date for PT Re-Evaluation 10/07/14   Authorization Type BCBS   Authorization Time Period 09/06/14 to 11/06/14   Authorization - Visit Number 2   Authorization - Number of Visits 30   PT Start Time 1020   PT Stop Time 1104   PT Time Calculation (min) 44 min   Activity Tolerance Patient tolerated treatment well   Behavior During Therapy [facility] for tasks assessed/performed      No past medical history on file.  No past surgical history on file.  There were no vitals filed for this visit.  Visit Diagnosis:  S/P ACL repair  Decreased range of motion of knee, left  Abnormality of gait  Decreased strength involving knee joint  Decreased strength, endurance, and mobility      Subjective Assessment - 09/10/14 1248    Subjective Pt states his knee is sore.  States he's been doing his HEP.   Currently in Pain? No/denies                       [facility] Adult PT Treatment/Exercise - 09/10/14 1128    Knee/Hip Exercises: Stretches   Active Hamstring Stretch 3 reps;30 seconds;Limitations   Active Hamstring Stretch Limitations 12 inch box   Knee/Hip Exercises: Standing   Forward Lunges Both;10 reps   Forward Lunges Limitations 6" step   Side Lunges Both;10 reps   Side Lunges Limitations 6" step   Functional Squat 1 set;10 reps   Functional Squat Limitations 0-40 degrees    Knee/Hip Exercises: Seated   Long Arc Quad Left;1 set;10 reps   Other Seated Knee Exercises --   Knee/Hip Exercises: Supine   Quad Sets Left;1 set;10 reps   Bridges Both;10 reps   Bridges  Limitations single leg bridges   Straight Leg Raises 10 reps   Straight Leg Raise with External Rotation Left;1 set;10 reps   Knee/Hip Exercises: Sidelying   Hip ABduction Left;10 reps   Knee/Hip Exercises: Prone   Hamstring Curl 10 reps   Hip Extension 10 reps   Manual Therapy   Manual Therapy Joint mobilization;Myofascial release   Joint Mobilization Patellar mobilizations lateral and proximal to distal    Myofascial Release to scar and surrounding musculature to decrease adhesions   Knee/Hip Exercises: Machines for Strengthening   Cybex Leg Press 3 Pl 10 reps singles                  PT Short Term Goals - 09/06/14 1223    PT SHORT TERM GOAL #1   Title Patient will demonstrate muscle strength of at least 4-/5 in L leg in order to improve knee stability and reduce discomfort during functional tasks   Time 6   Period Weeks   Status New   PT SHORT TERM GOAL #2   Title Patient will demonstrate R knee flexion of at least 115 degrees with minimal discomfort in order to enhance functional activity performance   Time 6   Period Weeks   Status New   PT SHORT TERM GOAL #3  Title Patient will demonstrate the ability to ambulate unlimited distances with no AD, equal step lengths, minimal fatigue, and good mechanics throughout    Time 6   Period Weeks   Status New   PT SHORT TERM GOAL #4   Title Patient will demonstrate the ability to safely and efficiently perform appropriate exercises regimens at home, within ACL precautions, in order to maintain personal fitness and functional task performance   Time 6   Period Weeks   Status New   PT SHORT TERM GOAL #5   Title Patient will demonstrate the ability to safely and efficiently perform appropriate HEP with independence   Time 6   Period Weeks   Status New           PT Long Term Goals - 09/06/14 1224    PT LONG TERM GOAL #1   Title Patient to be independent in advanced HEP    Time 12   Period Weeks   Status New    PT LONG TERM GOAL #2   Title Patient will demonstrate active L knee flexion of at least 125 degrees and extension of 0 degrees with pain 0/10   Time 12   Period Weeks   Status New   PT LONG TERM GOAL #3   Title Patient will demonstrate the ability to perform full depth squat to floor with return to stand with good mechanics and pain 0/10   Time 12   Period Weeks   Status New   PT LONG TERM GOAL #4   Title Patient will demonstrate the ability to perform bilateral leg hop with distance at least 6 inches and pain 0/10, good mechanics without pain and adequate knee stability present   Time 12   Period Weeks   Status New   PT LONG TERM GOAL #5   Title Patient will demonstrate bilateral lower extremity strength 5/5 in all tested muscle groups in order to facilitate return to functional tasks and sports performance   Time 12   Period Weeks   Status New               Plan - 09/10/14 1307    Clinical Impression Statement Progressed with prone and standing exercises per protocol ([facility] following ACL-PTG reconstruction). Pt is currently 3 weeks post-op, however did not begin therapy until end of last week. Currently progressing as in week 2 (1 week behind). Added forward/lateral lunges, hamstring curls, prone and sidelying exercises. Also added leg press. completed exercises bilaterally for LE's. Pt without c/o pain with any exercises. Pt was given updated HEP for mat exercises. ROM currently 2-105 in supine for LT knee (Rt knee is 0-145). Noted quad fatigue/tremor with exercises bilaterally   PT Frequency --  3x/week for 3 weeks, 2x/week for next 9 weeks   PT Duration --  3x/week for first 3 weeks, 2x/week for next 9 weeks   PT Next Visit Plan Continue functional stretching and strengthening. Progress per protocol.  Add bike for ROM and PRE's for Lt quadricep.  Also add hip stretches (piriformis, hip flexor) as well as gastroc stretch.   PT Home Exercise Plan Given for  supine, prone and sidelying therex   Consulted and Agree with Plan of Care Patient        Problem List Patient Active Problem List   Diagnosis Date Noted  . S/P ACL reconstruction 03/02/2011  . Knee pain 03/02/2011  . Knee stiffness 03/02/2011    Teena Irani, PTA/CLT [phone]  09/10/2014, 1:08 PM  [place] [address] [place], [place], [address] Phone: [phone]   Fax:  [phone]

## 2014-09-10 NOTE — Patient Instructions (Signed)
EXTENSION: Prone - Knee Extended (Active)   Lie on stomach, legs straight. Lift right leg toward ceiling. Use ___ lbs. Complete ___ sets of ___ repetitions. Perform ___ sessions per day.  http://gtsc.exer.us/70   Copyright  VHI. All rights reserved.  Bridging: with Straight Leg Raise   With legs bent, lift buttocks ____ inches from floor. Then slowly extend right knee, keeping stomach tight. Repeat ____ times per set. Do ____ sets per session. Do ____ sessions per day.  http://orth.exer.us/1104   Copyright  VHI. All rights reserved.  EXTENSION: Prone - Knee Flexed (Active)   Lie on stomach, right knee bent to 90. Lift leg toward ceiling. Use ___ lbs. Complete ___ sets of ___ repetitions. Perform ___ sessions per day.  http://gtsc.exer.us/66   Copyright  VHI. All rights reserved.    Lie on back. Lift leg with knee straight. Slowly lower leg for 3-5 seconds. ___ reps per set, ___ sets per day, ___ days per week. Lower like elevator, stopping at each floor. Add ___ lbs when you achieve ___ repetitions. Rest on elbows. Rest on straight arms.  Copyright  VHI. All rights reserved.  Abduction: Side Leg Lift (Eccentric) - Side-Lying   Lie on side. Lift top leg slightly higher than shoulder level. Keep top leg straight with body, toes pointing forward. Slowly lower for 3-5 seconds. ___ reps per set, ___ sets per day, ___ days per week. Add ___ lbs when you achieve ___ repetitions.  Copyright  VHI. All rights reserved.  Antiemboli: Isometric   Pull toes toward left knee, tense muscles on front of thigh and simultaneously squeeze buttocks. Keep leg and buttock flat on floor. Hold ____ seconds. Repeat ____ times per set. Do ____ sets per session. Do ____ sessions per day.  http://orth.exer.us/708   Copyright  VHI. All rights reserved.  Straight Leg Raise   Tighten stomach and slowly raise locked right leg ____ inches from floor. Repeat ____ times per set. Do ____ sets  per session. Do ____ sessions per day.  http://orth.exer.us/1102   Copyright  VHI. All rights reserved.

## 2014-09-12 ENCOUNTER — Ambulatory Visit (HOSPITAL_COMMUNITY): Payer: BLUE CROSS/BLUE SHIELD | Admitting: Physical Therapy

## 2014-09-12 DIAGNOSIS — R531 Weakness: Secondary | ICD-10-CM

## 2014-09-12 DIAGNOSIS — Z4789 Encounter for other orthopedic aftercare: Secondary | ICD-10-CM | POA: Diagnosis not present

## 2014-09-12 DIAGNOSIS — Z7409 Other reduced mobility: Secondary | ICD-10-CM

## 2014-09-12 DIAGNOSIS — R29898 Other symptoms and signs involving the musculoskeletal system: Secondary | ICD-10-CM

## 2014-09-12 DIAGNOSIS — Z9889 Other specified postprocedural states: Secondary | ICD-10-CM

## 2014-09-12 DIAGNOSIS — R6889 Other general symptoms and signs: Secondary | ICD-10-CM

## 2014-09-12 DIAGNOSIS — R269 Unspecified abnormalities of gait and mobility: Secondary | ICD-10-CM

## 2014-09-12 DIAGNOSIS — M24662 Ankylosis, left knee: Secondary | ICD-10-CM

## 2014-09-12 NOTE — Therapy (Signed)
[place], [place], [address] Phone: [phone]   Fax:  [phone]  Physical Therapy Treatment  Patient Details  Name: Justin Macdonald MRN: [id number] Date of Birth: Jan 18, 1971 Referring Provider:  Elsie Saas, MD  Encounter Date: 09/12/2014      PT End of Session - 09/12/14 1222    Visit Number 3   Number of Visits 27   Date for PT Re-Evaluation 10/07/14   Authorization Type BCBS   Authorization Time Period 09/06/14 to 11/06/14   Authorization - Visit Number 3   Authorization - Number of Visits 30   PT Start Time 0850   PT Stop Time 0940   PT Time Calculation (min) 50 min   Activity Tolerance Patient tolerated treatment well   Behavior During Therapy [facility] for tasks assessed/performed      No past medical history on file.  No past surgical history on file.  There were no vitals filed for this visit.  Visit Diagnosis:  S/P ACL repair  Decreased range of motion of knee, left  Abnormality of gait  Decreased strength involving knee joint  Decreased strength, endurance, and mobility      Subjective Assessment - 09/12/14 1057    Subjective Pt states only feeling stiffness into extension with standing.  PT reports only has pain in end ROM in flexion.              [facility] Adult PT Treatment/Exercise - 09/12/14 1057    Knee/Hip Exercises: Stretches   Active Hamstring Stretch 3 reps;30 seconds;Limitations   Active Hamstring Stretch Limitations 12 inch box   Quad Stretch 3 reps;30 seconds   Quad Stretch Limitations prone   Hip Flexor Stretch 3 reps;30 seconds   Knee: Self-Stretch to increase Flexion 3 reps   Knee: Self-Stretch Limitations onto 12" box   Gastroc Stretch 3 reps;30 seconds   Gastroc Stretch Limitations slant board   Knee/Hip Exercises: Aerobic   Stationary Bike 8 minutes seat 8   Knee/Hip Exercises: Machines for Strengthening   Cybex Leg Press total gym   Knee/Hip  Exercises: Standing   Knee Flexion Left;10 reps   Forward Lunges Both;10 reps   Forward Lunges Limitations 6" step   Side Lunges Both;10 reps   Side Lunges Limitations 6" step   Functional Squat 10 reps;2 sets   Functional Squat Limitations 0-40 degrees    Knee/Hip Exercises: Supine   Quad Sets Left;1 set;10 reps   Short Arc Quad Sets Left;10 reps;3 sets   Short Arc Quad Sets Limitations PRE 0#, 3#, 5#   Straight Leg Raises 10 reps;Left;3 sets   Straight Leg Raises Limitations PRE 0#, 3#, 5#                  PT Short Term Goals - 09/06/14 1223    PT SHORT TERM GOAL #1   Title Patient will demonstrate muscle strength of at least 4-/5 in L leg in order to improve knee stability and reduce discomfort during functional tasks   Time 6   Period Weeks   Status New   PT SHORT TERM GOAL #2   Title Patient will demonstrate R knee flexion of at least 115 degrees with minimal discomfort in order to enhance functional activity performance   Time 6   Period Weeks   Status New   PT SHORT TERM GOAL #3   Title Patient will demonstrate the ability to ambulate unlimited distances with no AD, equal step  lengths, minimal fatigue, and good mechanics throughout    Time 6   Period Weeks   Status New   PT SHORT TERM GOAL #4   Title Patient will demonstrate the ability to safely and efficiently perform appropriate exercises regimens at home, within ACL precautions, in order to maintain personal fitness and functional task performance   Time 6   Period Weeks   Status New   PT SHORT TERM GOAL #5   Title Patient will demonstrate the ability to safely and efficiently perform appropriate HEP with independence   Time 6   Period Weeks   Status New           PT Long Term Goals - 09/06/14 1224    PT LONG TERM GOAL #1   Title Patient to be independent in advanced HEP    Time 12   Period Weeks   Status New   PT LONG TERM GOAL #2   Title Patient will demonstrate active L knee flexion of  at least 125 degrees and extension of 0 degrees with pain 0/10   Time 12   Period Weeks   Status New   PT LONG TERM GOAL #3   Title Patient will demonstrate the ability to perform full depth squat to floor with return to stand with good mechanics and pain 0/10   Time 12   Period Weeks   Status New   PT LONG TERM GOAL #4   Title Patient will demonstrate the ability to perform bilateral leg hop with distance at least 6 inches and pain 0/10, good mechanics without pain and adequate knee stability present   Time 12   Period Weeks   Status New   PT LONG TERM GOAL #5   Title Patient will demonstrate bilateral lower extremity strength 5/5 in all tested muscle groups in order to facilitate return to functional tasks and sports performance   Time 12   Period Weeks   Status New               Plan - 09/12/14 1233    Clinical Impression Statement Increased to 2 sets of reps with exercises.  Added total gym for leg press/squat with better results than cybex leg press.  Completed PRES for quad with SAQ and SLR with noted muscle tremor due to weakness.  bike added to increase ROM with patient able to make full revolutions.  Added hip flexor and gastroc stretch today to POC.  Pt without pain with any therex.    PT Frequency --  3x/week for 3 weeks, 2x/week for next 9 weeks   PT Duration --  3x/week for first 3 weeks, 2x/week for next 9 weeks   PT Next Visit Plan Continue functional stretching and strengthening. Progress per ACL protocol.  Add piriformis stretch and begin step ups next visit.  progress to elliptical.  Begin next phase when AROM increases 0-115, Lt quad is 60% of Rt and when patient no longer has swelling or patellofemoral pain.   Pt will then be ready for progression of proprioception, strength and stability.         Problem List Patient Active Problem List   Diagnosis Date Noted  . S/P ACL reconstruction 03/02/2011  . Knee pain 03/02/2011  . Knee stiffness 03/02/2011     Teena Irani, PTA/CLT [phone] 09/12/2014, 12:53 PM  [place] [address] [place], [place], [address] Phone: [phone]   Fax:  [phone]

## 2014-09-18 ENCOUNTER — Ambulatory Visit (HOSPITAL_COMMUNITY): Payer: BLUE CROSS/BLUE SHIELD

## 2014-09-18 DIAGNOSIS — Z9889 Other specified postprocedural states: Secondary | ICD-10-CM

## 2014-09-18 DIAGNOSIS — R269 Unspecified abnormalities of gait and mobility: Secondary | ICD-10-CM

## 2014-09-18 DIAGNOSIS — R6889 Other general symptoms and signs: Secondary | ICD-10-CM

## 2014-09-18 DIAGNOSIS — R531 Weakness: Secondary | ICD-10-CM

## 2014-09-18 DIAGNOSIS — R29898 Other symptoms and signs involving the musculoskeletal system: Secondary | ICD-10-CM

## 2014-09-18 DIAGNOSIS — M24662 Ankylosis, left knee: Secondary | ICD-10-CM

## 2014-09-18 DIAGNOSIS — Z4789 Encounter for other orthopedic aftercare: Secondary | ICD-10-CM | POA: Diagnosis not present

## 2014-09-18 DIAGNOSIS — Z7409 Other reduced mobility: Secondary | ICD-10-CM

## 2014-09-18 NOTE — Therapy (Addendum)
[place], [place], [address] Phone: [phone]   Fax:  [phone]  Physical Therapy Treatment  Patient Details  Name: Justin Macdonald MRN: [id number] Date of Birth: 05/09/1971 Referring Provider:  Elsie Saas, MD  Encounter Date: 09/18/2014      PT End of Session - 09/18/14 0906    Visit Number 4   Number of Visits 27   Date for PT Re-Evaluation 10/07/14   Authorization Type BCBS   Authorization Time Period 09/06/14 to 11/06/14   Authorization - Visit Number 4   Authorization - Number of Visits 30   PT Start Time 0848   PT Stop Time 0932   PT Time Calculation (min) 44 min   Activity Tolerance Patient tolerated treatment well   Behavior During Therapy [facility] for tasks assessed/performed      No past medical history on file.  No past surgical history on file.  There were no vitals filed for this visit.  Visit Diagnosis:  S/P ACL repair  Decreased range of motion of knee, left  Abnormality of gait  Decreased strength involving knee joint  Decreased strength, endurance, and mobility      Subjective Assessment - 09/18/14 0855    Subjective Pt stated intermittent pain anterior medial portion of Lt knee with end range flexion.  Pt stated he went swimming and mowed grass yesterday with minimal difficulty just tired   Currently in Pain? No/denies            [facility] PT Assessment - 09/18/14 0001    Assessment   Medical Diagnosis s/p L ACL repair with meniscus repair and repair of bone    Onset Date 08/21/14   Next MD Visit Noemi Chapel 09/19/2014                   [facility] Adult PT Treatment/Exercise - 09/18/14 0001    Knee/Hip Exercises: Stretches   Active Hamstring Stretch 3 reps;30 seconds;Limitations   Active Hamstring Stretch Limitations 14in step 3 direction   Quad Stretch 3 reps;30 seconds   Quad Stretch Limitations prone with rope   Knee: Self-Stretch Limitations knee drives on  12 in step   Piriformis Stretch 3 reps;30 seconds   Piriformis Stretch Limitations supine figure 4   Gastroc Stretch 3 reps;30 seconds   Gastroc Stretch Limitations slant board   Knee/Hip Exercises: Aerobic   Stationary Bike 8 minutes seat 7   Knee/Hip Exercises: Standing   Forward Lunges Both;10 reps   Forward Lunges Limitations 6" step   Side Lunges Both;10 reps   Side Lunges Limitations 6" step   Lateral Step Up Left;10 reps;Hand Hold: 0;Step Height: 4"   Forward Step Up Left;10 reps;Hand Hold: 0;Step Height: 6"   Functional Squat 10 reps;2 sets   Functional Squat Limitations 0-40 degrees                   PT Short Term Goals - 09/18/14 0906    PT SHORT TERM GOAL #1   Title Patient will demonstrate muscle strength of at least 4-/5 in L leg in order to improve knee stability and reduce discomfort during functional tasks   Status On-going   PT SHORT TERM GOAL #2   Title Patient will demonstrate R knee flexion of at least 115 degrees with minimal discomfort in order to enhance functional activity performance   Baseline 09/18/2014 0-123   Status Achieved   PT SHORT TERM GOAL #3   Title  Patient will demonstrate the ability to ambulate unlimited distances with no AD, equal step lengths, minimal fatigue, and good mechanics throughout    Status On-going   PT SHORT TERM GOAL #4   Title Patient will demonstrate the ability to safely and efficiently perform appropriate exercises regimens at home, within ACL precautions, in order to maintain personal fitness and functional task performance   Status On-going   PT SHORT TERM GOAL #5   Title Patient will demonstrate the ability to safely and efficiently perform appropriate HEP with independence   Baseline 09/18/2014 Reports compliance with HEP and able to verbalize/demonstrate appropriate form with HEP   Status Achieved           PT Long Term Goals - 09/18/14 0908    PT LONG TERM GOAL #1   Title Patient to be independent in  advanced HEP    PT LONG TERM GOAL #2   Title Patient will demonstrate active L knee flexion of at least 135 degrees and extension of 0 degrees with pain 0/10   Status Revised   PT LONG TERM GOAL #3   Title Patient will demonstrate the ability to perform full depth squat to floor with return to stand with good mechanics and pain 0/10   PT LONG TERM GOAL #4   Title Patient will demonstrate the ability to perform bilateral leg hop with distance at least 6 inches and pain 0/10, good mechanics without pain and adequate knee stability present   PT LONG TERM GOAL #5   Title Patient will demonstrate bilateral lower extremity strength 5/5 in all tested muscle groups in order to facilitate return to functional tasks and sports performance               Plan - 09/18/14 0930    Clinical Impression Statement Continued session focus on ACL protocol, pt is progressing welll towards.  AROM improved to 0-123.  Reported increase in ROM to evaluated DPT, long term goal increased to 135 to make symmetrical with opposite knee.  Continued stretches to improve flexibilty, added piriformis stretches to POC to improve hip moiblty.  Progressed to stair training with cueing for control without difficulty. No reports of pain at end of session.     PT Next Visit Plan Continue functional stretching and strengthening. Progress per ACL protocol.  Progress to elliptical.  Begin next phase Lt quad is 60% of Rt and when patient no longer has swelling or patellofemoral pain.   Pt will then be ready for progression of proprioception, strength and stability.         Problem List Patient Active Problem List   Diagnosis Date Noted  . S/P ACL reconstruction 03/02/2011  . Knee pain 03/02/2011  . Knee stiffness 03/02/2011   Ihor Austin, [place]; [place] #[address] [phone]  Aldona Lento 09/18/2014, 10:59 AM  [place], [place], [address] Phone:  [phone]   Fax:  [phone]

## 2014-09-20 ENCOUNTER — Ambulatory Visit (HOSPITAL_COMMUNITY): Payer: BLUE CROSS/BLUE SHIELD

## 2014-09-20 DIAGNOSIS — R6889 Other general symptoms and signs: Secondary | ICD-10-CM

## 2014-09-20 DIAGNOSIS — R269 Unspecified abnormalities of gait and mobility: Secondary | ICD-10-CM

## 2014-09-20 DIAGNOSIS — R29898 Other symptoms and signs involving the musculoskeletal system: Secondary | ICD-10-CM

## 2014-09-20 DIAGNOSIS — Z9889 Other specified postprocedural states: Secondary | ICD-10-CM

## 2014-09-20 DIAGNOSIS — M24662 Ankylosis, left knee: Secondary | ICD-10-CM

## 2014-09-20 DIAGNOSIS — R531 Weakness: Secondary | ICD-10-CM

## 2014-09-20 DIAGNOSIS — Z4789 Encounter for other orthopedic aftercare: Secondary | ICD-10-CM | POA: Diagnosis not present

## 2014-09-20 DIAGNOSIS — Z7409 Other reduced mobility: Secondary | ICD-10-CM

## 2014-09-20 NOTE — Therapy (Signed)
[place], [place], [address] Phone: [phone]   Fax:  [phone]  Physical Therapy Treatment  Patient Details  Name: Justin Macdonald MRN: [id number] Date of Birth: March 08, 1971 Referring Provider:  Elsie Saas, MD  Encounter Date: 09/20/2014      PT End of Session - 09/20/14 1018    Visit Number 5   Number of Visits 27   Date for PT Re-Evaluation 10/07/14   Authorization Type BCBS   Authorization Time Period 09/06/14 to 11/06/14   Authorization - Visit Number 5   Authorization - Number of Visits 30   PT Start Time 1012   PT Stop Time 1100   PT Time Calculation (min) 48 min   Activity Tolerance Patient tolerated treatment well   Behavior During Therapy [facility] for tasks assessed/performed      No past medical history on file.  No past surgical history on file.  There were no vitals filed for this visit.  Visit Diagnosis:  S/P ACL repair  Decreased range of motion of knee, left  Abnormality of gait  Decreased strength involving knee joint  Decreased strength, endurance, and mobility      Subjective Assessment - 09/20/14 1013    Subjective Pt reported went to MD yesterday and encouraged to return to the [facility] and to reduce time spent wearing brace.  RTW in 4 weeks, ot doesnt feel ready for return to full    Currently in Pain? No/denies            [facility] PT Assessment - 09/20/14 0001    Assessment   Medical Diagnosis s/p L ACL repair with meniscus repair and repair of bone    Onset Date 08/21/14   Next MD Visit Noemi Chapel 4 weeks for 09/20/2014          [facility] Adult PT Treatment/Exercise - 09/20/14 0001    Knee/Hip Exercises: Stretches   Active Hamstring Stretch 3 reps;30 seconds;Limitations   Active Hamstring Stretch Limitations 14in step 3 direction   Quad Stretch 3 reps;30 seconds   Quad Stretch Limitations prone with rope   Gastroc Stretch 3 reps;30 seconds   Gastroc Stretch  Limitations slant board   Knee/Hip Exercises: Aerobic   Stationary Bike 8 minutes seat 5 for ROM   Knee/Hip Exercises: Machines for Strengthening   Cybex Knee Extension 1 rep max Lt 3.5Pl (31.5#) Rt 7Pl (70#)   Knee/Hip Exercises: Standing   Forward Lunges Both;15 reps   Forward Lunges Limitations 4in step   Side Lunges Both;15 reps   Side Lunges Limitations 4in step   Lateral Step Up Left;Hand Hold: 1;Step Height: 6";10 reps   Forward Step Up Left;15 reps;Step Height: 6"   Functional Squat 15 reps   Functional Squat Limitations 0-40 degrees    Knee/Hip Exercises: Supine   Short Arc Quad Sets Left;2 sets;10 reps   Short Arc Quad Sets Limitations PRE 0#, 4#, 7 1/2#   Straight Leg Raises 2 sets;10 reps   Straight Leg Raises Limitations PRE 0#, 4#, 7 1/2#               PT Short Term Goals - 09/20/14 1019    PT SHORT TERM GOAL #1   Title Patient will demonstrate muscle strength of at least 4-/5 in L leg in order to improve knee stability and reduce discomfort during functional tasks   Status On-going   PT SHORT TERM GOAL #2   Title Patient will demonstrate R knee  flexion of at least 115 degrees with minimal discomfort in order to enhance functional activity performance   Baseline 09/18/2014 0-123   Status Achieved   PT SHORT TERM GOAL #3   Title Patient will demonstrate the ability to ambulate unlimited distances with no AD, equal step lengths, minimal fatigue, and good mechanics throughout    Status On-going   PT SHORT TERM GOAL #4   Title Patient will demonstrate the ability to safely and efficiently perform appropriate exercises regimens at home, within ACL precautions, in order to maintain personal fitness and functional task performance   Status On-going   PT SHORT TERM GOAL #5   Title Patient will demonstrate the ability to safely and efficiently perform appropriate HEP with independence   Status Achieved           PT Long Term Goals - 09/20/14 1053    PT LONG  TERM GOAL #1   Title Patient to be independent in advanced HEP    PT LONG TERM GOAL #2   Title Patient will demonstrate active L knee flexion of at least 135 degrees and extension of 0 degrees with pain 0/10   Status On-going   PT LONG TERM GOAL #3   Title Patient will demonstrate the ability to perform full depth squat to floor with return to stand with good mechanics and pain 0/10   Baseline Half squat per protocol   PT LONG TERM GOAL #4   Title Patient will demonstrate the ability to perform bilateral leg hop with distance at least 6 inches and pain 0/10, good mechanics without pain and adequate knee stability present   PT LONG TERM GOAL #5   Title Patient will demonstrate bilateral lower extremity strength 5/5 in all tested muscle groups in order to facilitate return to functional tasks and sports performance   Status On-going               Plan - 09/20/14 1027    Clinical Impression Statement 4 week post-op, 1 rep max complete with quad strength at 45% compared to Rt LE; continued with week 3 protocol until knee is 60% to Rt.  Continued with PRE for SLR and SAQ for quad strengthening, increased to 0#, 4# and 7 1/2#, no reports of pain; noted visible quad fatigue with SLR.     PT Next Visit Plan Continue functional stretching and strengthening. Progress per ACL protocol.  Progress to elliptical.  Begin next phase Lt quad is 60% of Rt and when patient no longer has swelling or patellofemoral pain.   Pt will then be ready for progression of proprioception, strength and stability.         Problem List Patient Active Problem List   Diagnosis Date Noted  . S/P ACL reconstruction 03/02/2011  . Knee pain 03/02/2011  . Knee stiffness 03/02/2011   Ihor Austin, [place]; [place] #[address] [phone]  Aldona Lento 09/20/2014, 11:02 AM  [place], [place], [address] Phone: [phone]   Fax:   [phone]

## 2014-09-23 ENCOUNTER — Ambulatory Visit (HOSPITAL_COMMUNITY): Payer: BLUE CROSS/BLUE SHIELD | Admitting: Physical Therapy

## 2014-09-23 DIAGNOSIS — R29898 Other symptoms and signs involving the musculoskeletal system: Secondary | ICD-10-CM

## 2014-09-23 DIAGNOSIS — Z4789 Encounter for other orthopedic aftercare: Secondary | ICD-10-CM | POA: Diagnosis not present

## 2014-09-23 DIAGNOSIS — R531 Weakness: Secondary | ICD-10-CM

## 2014-09-23 DIAGNOSIS — M24662 Ankylosis, left knee: Secondary | ICD-10-CM

## 2014-09-23 DIAGNOSIS — Z7409 Other reduced mobility: Secondary | ICD-10-CM

## 2014-09-23 DIAGNOSIS — R269 Unspecified abnormalities of gait and mobility: Secondary | ICD-10-CM

## 2014-09-23 DIAGNOSIS — Z9889 Other specified postprocedural states: Secondary | ICD-10-CM

## 2014-09-23 NOTE — Therapy (Signed)
[place], [place], [address] Phone: [phone]   Fax:  [phone]  Physical Therapy Treatment  Patient Details  Name: Justin Macdonald MRN: [id number] Date of Birth: 05-Jan-1971 Referring Provider:  Monico Blitz, MD  Encounter Date: 09/23/2014      PT End of Session - 09/23/14 1024    Visit Number 6   Number of Visits 27   Date for PT Re-Evaluation 10/07/14   Authorization Type BCBS   Authorization Time Period 09/06/14 to 11/06/14   Authorization - Visit Number 6   Authorization - Number of Visits 30   Activity Tolerance Patient tolerated treatment well   Behavior During Therapy [facility] for tasks assessed/performed      No past medical history on file.  No past surgical history on file.  There were no vitals filed for this visit.  Visit Diagnosis:  S/P ACL repair  Decreased range of motion of knee, left  Abnormality of gait  Decreased strength involving knee joint  Decreased strength, endurance, and mobility      Subjective Assessment - 09/23/14 0950    Subjective Patient reports that he is doing well today, went out to the garden and worked the other day.  States that he is still not feeling ready to return to work, feeling kind of nervous about return to work on full duty.    Pertinent History Patient has had surgery on his opposite knee in the past for ACL repair. When he was [place] his other knee, he did something to his L knee and it stayed swollen for years; about 6 months ago, knee began falling out of joint. ACL, mensiscus, and bone repair done  on 08/21/14.    Currently in Pain? No/denies                       [facility] Adult PT Treatment/Exercise - 09/23/14 0001    Knee/Hip Exercises: Stretches   Active Hamstring Stretch 3 reps;30 seconds;Limitations   Active Hamstring Stretch Limitations 14in step 3 direction   Quad Stretch 3 reps;30 seconds   Quad Stretch Limitations  prone with rope   Gastroc Stretch 3 reps;30 seconds   Gastroc Stretch Limitations slant board   Knee/Hip Exercises: Standing   Forward Lunges Both;1 set;15 reps   Forward Lunges Limitations 4 inch step    Side Lunges Both;15 reps   Side Lunges Limitations 4in step   Functional Squat 1 set;15 reps   Functional Squat Limitations 0-40 degrees    Knee/Hip Exercises: Supine   Short Arc Quad Sets Left;2 sets;10 reps   Short Arc Quad Sets Limitations PRE 0#, 4#, 7.5#   Straight Leg Raises Left;2 sets;10 reps   Straight Leg Raises Limitations PRE, 4#, 7.5#, 0#                 PT Education - 09/23/14 1021    Education provided Yes   Education Details Education regarding knee precautions, overall education regarding deep squat form with lifting    Person(s) Educated Patient   Methods Explanation   Comprehension Verbalized understanding          PT Short Term Goals - 09/20/14 1019    PT SHORT TERM GOAL #1   Title Patient will demonstrate muscle strength of at least 4-/5 in L leg in order to improve knee stability and reduce discomfort during functional tasks   Status On-going   PT SHORT TERM GOAL #2  Title Patient will demonstrate R knee flexion of at least 115 degrees with minimal discomfort in order to enhance functional activity performance   Baseline 09/18/2014 0-123   Status Achieved   PT SHORT TERM GOAL #3   Title Patient will demonstrate the ability to ambulate unlimited distances with no AD, equal step lengths, minimal fatigue, and good mechanics throughout    Status On-going   PT SHORT TERM GOAL #4   Title Patient will demonstrate the ability to safely and efficiently perform appropriate exercises regimens at home, within ACL precautions, in order to maintain personal fitness and functional task performance   Status On-going   PT SHORT TERM GOAL #5   Title Patient will demonstrate the ability to safely and efficiently perform appropriate HEP with independence    Status Achieved           PT Long Term Goals - 09/20/14 1053    PT LONG TERM GOAL #1   Title Patient to be independent in advanced HEP    PT LONG TERM GOAL #2   Title Patient will demonstrate active L knee flexion of at least 135 degrees and extension of 0 degrees with pain 0/10   Status On-going   PT LONG TERM GOAL #3   Title Patient will demonstrate the ability to perform full depth squat to floor with return to stand with good mechanics and pain 0/10   Baseline Half squat per protocol   PT LONG TERM GOAL #4   Title Patient will demonstrate the ability to perform bilateral leg hop with distance at least 6 inches and pain 0/10, good mechanics without pain and adequate knee stability present   PT LONG TERM GOAL #5   Title Patient will demonstrate bilateral lower extremity strength 5/5 in all tested muscle groups in order to facilitate return to functional tasks and sports performance   Status On-going               Plan - 09/23/14 1025    Clinical Impression Statement Patient arrived 18 minutes late today. Continued functional exercise program today with focus on week 3 protocol until knee is at 60% of R quad strength (45% last measure). Reduced difficulty with SLRs and table work today. Patient continues to state that he is nervous regarding return to work, does not feel that he will be quite ready. No pain with exercises today, cues for form.    Pt will benefit from skilled therapeutic intervention in order to improve on the following deficits Abnormal gait;Decreased coordination;Decreased range of motion;Difficulty walking;Impaired flexibility;Decreased endurance;Increased edema;Decreased activity tolerance;Decreased knowledge of precautions;Decreased balance;Decreased mobility;Decreased strength   Rehab Potential Good   PT Frequency Other (comment)   PT Duration Other (comment)   PT Treatment/Interventions ADLs/Self Care Home Management;Gait training;Neuromuscular  re-education;Stair training;Functional mobility training;Patient/family education;Passive range of motion;Cryotherapy;Therapeutic activities;Electrical Stimulation;Therapeutic exercise;Manual techniques;Balance training   PT Next Visit Plan Continue functional stretching and strengthening. Progress per ACL protocol.  Progress to elliptical.  Begin next phase Lt quad is 60% of Rt and when patient no longer has swelling or patellofemoral pain.   Pt will then be ready for progression of proprioception, strength and stability.    Consulted and Agree with Plan of Care Patient        Problem List Patient Active Problem List   Diagnosis Date Noted  . S/P ACL reconstruction 03/02/2011  . Knee pain 03/02/2011  . Knee stiffness 03/02/2011    Deniece Ree PT, DPT [phone]  [facility]  [place] [address] [place], [place], [address] Phone: [phone]   Fax:  [phone]

## 2014-09-25 ENCOUNTER — Ambulatory Visit (HOSPITAL_COMMUNITY): Payer: BLUE CROSS/BLUE SHIELD | Admitting: Physical Therapy

## 2014-09-25 DIAGNOSIS — R29898 Other symptoms and signs involving the musculoskeletal system: Secondary | ICD-10-CM

## 2014-09-25 DIAGNOSIS — Z7409 Other reduced mobility: Secondary | ICD-10-CM

## 2014-09-25 DIAGNOSIS — R269 Unspecified abnormalities of gait and mobility: Secondary | ICD-10-CM

## 2014-09-25 DIAGNOSIS — R531 Weakness: Secondary | ICD-10-CM

## 2014-09-25 DIAGNOSIS — R6889 Other general symptoms and signs: Secondary | ICD-10-CM

## 2014-09-25 DIAGNOSIS — M24662 Ankylosis, left knee: Secondary | ICD-10-CM

## 2014-09-25 DIAGNOSIS — Z9889 Other specified postprocedural states: Secondary | ICD-10-CM

## 2014-09-25 DIAGNOSIS — Z4789 Encounter for other orthopedic aftercare: Secondary | ICD-10-CM | POA: Diagnosis not present

## 2014-09-25 NOTE — Therapy (Signed)
[place], [place], [address] Phone: [phone]   Fax:  [phone]  Physical Therapy Treatment  Patient Details  Name: MARKIES MOWATT MRN: [id number] Date of Birth: [birth year] Referring Provider:  Elsie Saas, MD  Encounter Date: 09/25/2014      PT End of Session - 09/25/14 1023    Visit Number 7   Number of Visits 27   Date for PT Re-Evaluation 10/07/14   Authorization Type BCBS   Authorization Time Period 09/06/14 to 11/06/14   Authorization - Visit Number 7   Authorization - Number of Visits 30   PT Start Time 0931   PT Stop Time 1011   PT Time Calculation (min) 40 min   Activity Tolerance Patient tolerated treatment well   Behavior During Therapy [facility] for tasks assessed/performed      No past medical history on file.  No past surgical history on file.  There were no vitals filed for this visit.  Visit Diagnosis:  S/P ACL repair  Decreased range of motion of knee, left  Abnormality of gait  Decreased strength involving knee joint  Decreased strength, endurance, and mobility      Subjective Assessment - 09/25/14 0932    Subjective Patient reports he is doing well today, feeling a little stiff and sore today with pain 2-3/10 but states that this is usually where his pain is sitting    Pertinent History Patient has had surgery on his opposite knee in the past for ACL repair. When he was [place] his other knee, he did something to his L knee and it stayed swollen for years; about 6 months ago, knee began falling out of joint. ACL, mensiscus, and bone repair done  on 08/21/14.    Currently in Pain? Yes   Pain Score 2    Pain Location Knee   Pain Orientation Left                         [facility] Adult PT Treatment/Exercise - 09/25/14 0001    Knee/Hip Exercises: Stretches   Active Hamstring Stretch 3 reps;30 seconds;Limitations   Quad Stretch 3 reps;30 seconds   Quad  Stretch Limitations prone with rope   Piriformis Stretch 3 reps;30 seconds   Piriformis Stretch Limitations supine    Gastroc Stretch 3 reps;30 seconds   Gastroc Stretch Limitations slant board   Knee/Hip Exercises: Aerobic   Stationary Bike 10 minutes at seat 5    Knee/Hip Exercises: Standing   Forward Lunges Both;1 set;15 reps   Forward Lunges Limitations 4 inch box    Side Lunges Both;1 set;15 reps   Side Lunges Limitations 4 inch step    Functional Squat 2 sets;15 reps   Functional Squat Limitations 0-40 degrees, toes neutral and toes out    Other Standing Knee Exercises 3D hip excursions 1x15 (no transverse plane)    Knee/Hip Exercises: Supine   Short Arc Quad Sets Left;2 sets;15 reps   Short Arc Quad Sets Limitations PRE 0#, 4#, 7.5#   Straight Leg Raises Left;2 sets;15 reps   Straight Leg Raises Limitations PRE 0#, 4#, 7.5#; second set with toes out                 PT Education - 09/25/14 1023    Education provided Yes   Education Details Education on tapering up activity on road bike (patient's MD had given him permission to begin this activity)  Person(s) Educated Patient   Methods Explanation   Comprehension Verbalized understanding          PT Short Term Goals - 09/20/14 1019    PT SHORT TERM GOAL #1   Title Patient will demonstrate muscle strength of at least 4-/5 in L leg in order to improve knee stability and reduce discomfort during functional tasks   Status On-going   PT SHORT TERM GOAL #2   Title Patient will demonstrate R knee flexion of at least 115 degrees with minimal discomfort in order to enhance functional activity performance   Baseline 09/18/2014 0-123   Status Achieved   PT SHORT TERM GOAL #3   Title Patient will demonstrate the ability to ambulate unlimited distances with no AD, equal step lengths, minimal fatigue, and good mechanics throughout    Status On-going   PT SHORT TERM GOAL #4   Title Patient will demonstrate the ability to  safely and efficiently perform appropriate exercises regimens at home, within ACL precautions, in order to maintain personal fitness and functional task performance   Status On-going   PT SHORT TERM GOAL #5   Title Patient will demonstrate the ability to safely and efficiently perform appropriate HEP with independence   Status Achieved           PT Long Term Goals - 09/20/14 1053    PT LONG TERM GOAL #1   Title Patient to be independent in advanced HEP    PT LONG TERM GOAL #2   Title Patient will demonstrate active L knee flexion of at least 135 degrees and extension of 0 degrees with pain 0/10   Status On-going   PT LONG TERM GOAL #3   Title Patient will demonstrate the ability to perform full depth squat to floor with return to stand with good mechanics and pain 0/10   Baseline Half squat per protocol   PT LONG TERM GOAL #4   Title Patient will demonstrate the ability to perform bilateral leg hop with distance at least 6 inches and pain 0/10, good mechanics without pain and adequate knee stability present   PT LONG TERM GOAL #5   Title Patient will demonstrate bilateral lower extremity strength 5/5 in all tested muscle groups in order to facilitate return to functional tasks and sports performance   Status On-going               Plan - 09/25/14 1024    Clinical Impression Statement Continued functional exercise program with increased repetitions and modification of toes out during some exercises; patient tolerated todays' session well with no complaints of increased pain throughout or after the session. States his MD has given him permission to begin road biking and was education on appropriately progressing his distances.    Pt will benefit from skilled therapeutic intervention in order to improve on the following deficits Abnormal gait;Decreased coordination;Decreased range of motion;Difficulty walking;Impaired flexibility;Decreased endurance;Increased edema;Decreased  activity tolerance;Decreased knowledge of precautions;Decreased balance;Decreased mobility;Decreased strength   Rehab Potential Good   PT Frequency Other (comment)   PT Treatment/Interventions ADLs/Self Care Home Management;Gait training;Neuromuscular re-education;Stair training;Functional mobility training;Patient/family education;Passive range of motion;Cryotherapy;Therapeutic activities;Electrical Stimulation;Therapeutic exercise;Manual techniques;Balance training   PT Next Visit Plan Continue functional stretching and strengthening. Progress per ACL protocol.  Progress to elliptical.  Begin next phase Lt quad is 60% of Rt and when patient no longer has swelling or patellofemoral pain.   Pt will then be ready for progression of proprioception, strength and stability.    PT  Home Exercise Plan Given for supine, prone and sidelying therex   Consulted and Agree with Plan of Care Patient        Problem List Patient Active Problem List   Diagnosis Date Noted  . S/P ACL reconstruction 03/02/2011  . Knee pain 03/02/2011  . Knee stiffness 03/02/2011   Deniece Ree PT, DPT [place] [address] [place], [place], [address] Phone: [phone]   Fax:  [phone]

## 2014-09-27 ENCOUNTER — Ambulatory Visit (HOSPITAL_COMMUNITY): Payer: BLUE CROSS/BLUE SHIELD | Admitting: Physical Therapy

## 2014-09-27 DIAGNOSIS — R269 Unspecified abnormalities of gait and mobility: Secondary | ICD-10-CM

## 2014-09-27 DIAGNOSIS — R29898 Other symptoms and signs involving the musculoskeletal system: Secondary | ICD-10-CM

## 2014-09-27 DIAGNOSIS — Z9889 Other specified postprocedural states: Secondary | ICD-10-CM

## 2014-09-27 DIAGNOSIS — Z4789 Encounter for other orthopedic aftercare: Secondary | ICD-10-CM | POA: Diagnosis not present

## 2014-09-27 DIAGNOSIS — M24662 Ankylosis, left knee: Secondary | ICD-10-CM

## 2014-09-27 NOTE — Therapy (Signed)
[place], [place], [address] Phone: [phone]   Fax:  [phone]  Physical Therapy Treatment  Patient Details  Name: Justin Macdonald MRN: [id number] Date of Birth: [birth year] Referring Provider:  Elsie Saas, MD  Encounter Date: 09/27/2014      PT End of Session - 09/27/14 1706    Visit Number 8   Number of Visits 27   Date for PT Re-Evaluation 10/07/14   Authorization Type BCBS   Authorization - Visit Number 8   Authorization - Number of Visits 30   PT Start Time 0940   PT Stop Time 1022   PT Time Calculation (min) 42 min      No past medical history on file.  No past surgical history on file.  There were no vitals filed for this visit.  Visit Diagnosis:  S/P ACL repair  Decreased range of motion of knee, left  Abnormality of gait  Decreased strength involving knee joint      Subjective Assessment - 09/27/14 1005    Subjective Going down steps is the most difficult thing for me to do now.   PT is 5 weeks post op   Currently in Pain? No/denies                 [facility] Adult PT Treatment/Exercise - 09/27/14 0001    Knee/Hip Exercises: Aerobic   Elliptical 10:00   Knee/Hip Exercises: Standing   Heel Raises 15 reps   Heel Raises Limitations Lt only    Lateral Step Up Left;15 reps   Forward Step Up Left;10 reps;Hand Hold: 0;Step Height: 6"   Functional Squat 10 reps   Functional Squat Limitations lt only    Rocker Board 2 minutes   SLS with Vectors vector 10 seconds x 3 LT    Knee/Hip Exercises: Supine   Quad Sets 10 reps   Short Arc Quad Sets Left   Short Arc Quad Sets Limitations PRE 08/09/08# 10 x Each   Straight Leg Raises Left   Straight Leg Raises Limitations PRE ,5,10,13 #x 10 each                 PT Short Term Goals - 09/20/14 1019    PT SHORT TERM GOAL #1   Title Patient will demonstrate muscle strength of at least 4-/5 in L leg in order to improve knee  stability and reduce discomfort during functional tasks   Status On-going   PT SHORT TERM GOAL #2   Title Patient will demonstrate R knee flexion of at least 115 degrees with minimal discomfort in order to enhance functional activity performance   Baseline 09/18/2014 0-123   Status Achieved   PT SHORT TERM GOAL #3   Title Patient will demonstrate the ability to ambulate unlimited distances with no AD, equal step lengths, minimal fatigue, and good mechanics throughout    Status On-going   PT SHORT TERM GOAL #4   Title Patient will demonstrate the ability to safely and efficiently perform appropriate exercises regimens at home, within ACL precautions, in order to maintain personal fitness and functional task performance   Status On-going   PT SHORT TERM GOAL #5   Title Patient will demonstrate the ability to safely and efficiently perform appropriate HEP with independence   Status Achieved           PT Long Term Goals - 09/20/14 1053    PT LONG TERM GOAL #1   Title Patient  to be independent in advanced HEP    PT LONG TERM GOAL #2   Title Patient will demonstrate active L knee flexion of at least 135 degrees and extension of 0 degrees with pain 0/10   Status On-going   PT LONG TERM GOAL #3   Title Patient will demonstrate the ability to perform full depth squat to floor with return to stand with good mechanics and pain 0/10   Baseline Half squat per protocol   PT LONG TERM GOAL #4   Title Patient will demonstrate the ability to perform bilateral leg hop with distance at least 6 inches and pain 0/10, good mechanics without pain and adequate knee stability present   PT LONG TERM GOAL #5   Title Patient will demonstrate bilateral lower extremity strength 5/5 in all tested muscle groups in order to facilitate return to functional tasks and sports performance   Status On-going               Plan - 09/27/14 1706    Clinical Impression Statement Pt progressing will through  protocol.  Pt had no difficulty riding his road bike yesterday.  Added weight to SAQ and Ham curls with no difficulty    PT Next Visit Plan Pt is 5 week post op continue with protocol .  Add wt to SAQ and ham curls         Problem List Patient Active Problem List   Diagnosis Date Noted  . S/P ACL reconstruction 03/02/2011  . Knee pain 03/02/2011  . Knee stiffness 03/02/2011  Rayetta Humphrey, PT CLT [phone] 09/27/2014, 5:13 PM  [place] [address] [place], [place], [address] Phone: [phone]   Fax:  [phone]

## 2014-10-01 ENCOUNTER — Ambulatory Visit (HOSPITAL_COMMUNITY): Payer: BLUE CROSS/BLUE SHIELD

## 2014-10-01 DIAGNOSIS — M24662 Ankylosis, left knee: Secondary | ICD-10-CM

## 2014-10-01 DIAGNOSIS — R531 Weakness: Secondary | ICD-10-CM

## 2014-10-01 DIAGNOSIS — R269 Unspecified abnormalities of gait and mobility: Secondary | ICD-10-CM

## 2014-10-01 DIAGNOSIS — R29898 Other symptoms and signs involving the musculoskeletal system: Secondary | ICD-10-CM

## 2014-10-01 DIAGNOSIS — Z7409 Other reduced mobility: Secondary | ICD-10-CM

## 2014-10-01 DIAGNOSIS — Z9889 Other specified postprocedural states: Secondary | ICD-10-CM

## 2014-10-01 DIAGNOSIS — Z4789 Encounter for other orthopedic aftercare: Secondary | ICD-10-CM | POA: Diagnosis not present

## 2014-10-01 NOTE — Therapy (Signed)
[place], [place], [address] Phone: [phone]   Fax:  [phone]  Physical Therapy Treatment  Patient Details  Name: Justin Macdonald MRN: [id number] Date of Birth: 1970-10-10 Referring Provider:  Elsie Saas, MD  Encounter Date: 10/01/2014      PT End of Session - 10/01/14 0951    Visit Number 9   Number of Visits 27   Date for PT Re-Evaluation 10/07/14   Authorization Type BCBS   Authorization Time Period 09/06/14 to 11/06/14   Authorization - Visit Number 9   Authorization - Number of Visits 30   PT Start Time 0935   PT Stop Time 1026   PT Time Calculation (min) 51 min   Activity Tolerance Patient tolerated treatment well   Behavior During Therapy [facility] for tasks assessed/performed      No past medical history on file.  No past surgical history on file.  There were no vitals filed for this visit.  Visit Diagnosis:  S/P ACL repair  Decreased range of motion of knee, left  Abnormality of gait  Decreased strength involving knee joint  Decreased strength, endurance, and mobility      Subjective Assessment - 10/01/14 0940    Subjective Pain free, reported down steps is making improvements.  Rode 3 miles on bicycle yesterday   Currently in Pain? No/denies            [facility] PT Assessment - 10/01/14 0001    Assessment   Medical Diagnosis s/p L ACL repair with meniscus repair and repair of bone    Onset Date 08/21/14   Next MD Visit [facility] around 10/18/2014                     [facility] Adult PT Treatment/Exercise - 10/01/14 1059    Exercises   Exercises Knee/Hip   Knee/Hip Exercises: Stretches   Active Hamstring Stretch 3 reps;30 seconds;Limitations   Active Hamstring Stretch Limitations 14in step 3 direction   Gastroc Stretch 3 reps;30 seconds   Gastroc Stretch Limitations slant board   Knee/Hip Exercises: Aerobic   Elliptical 10:00   Knee/Hip Exercises: Standing   Heel Raises 2 sets;10 reps   Heel Raises Limitations Up Bil, down Lt only for eccentric control   Lateral Step Up Left;15 reps;Step Height: 6"   Forward Step Up Left;10 reps;Hand Hold: 0;Step Height: 6"   Functional Squat 10 reps;2 sets   Functional Squat Limitations lt only    Rocker Board 2 minutes   Rocker Board Limitations R/L   SLS with Vectors vector 10 seconds x 3 LT    Knee/Hip Exercises: Supine   Short Arc Quad Sets Left   Terminal Knee Extension Left;10 reps;Limitations   Terminal Knee Extension Limitations 5" holds   Straight Leg Raises Left   Straight Leg Raises Limitations 7 1/2, 10, 13#   Knee/Hip Exercises: Machines for Strengthening   Total Gym Leg Press Quad 1 rep max Lt 57.5 #, Rt 74.5# 77%                  PT Short Term Goals - 10/01/14 3716    PT SHORT TERM GOAL #1   Title Patient will demonstrate muscle strength of at least 4-/5 in L leg in order to improve knee stability and reduce discomfort during functional tasks   Status On-going   PT SHORT TERM GOAL #2   Title Patient will demonstrate R knee flexion of at  least 115 degrees with minimal discomfort in order to enhance functional activity performance   Status Achieved   PT SHORT TERM GOAL #3   Title Patient will demonstrate the ability to ambulate unlimited distances with no AD, equal step lengths, minimal fatigue, and good mechanics throughout    Status On-going   PT SHORT TERM GOAL #4   Title Patient will demonstrate the ability to safely and efficiently perform appropriate exercises regimens at home, within ACL precautions, in order to maintain personal fitness and functional task performance   Status On-going   PT SHORT TERM GOAL #5   Title Patient will demonstrate the ability to safely and efficiently perform appropriate HEP with independence   Status Achieved           PT Long Term Goals - 10/01/14 0952    PT LONG TERM GOAL #1   Title Patient to be independent in advanced HEP     Status On-going   PT LONG TERM GOAL #2   Title Patient will demonstrate active L knee flexion of at least 135 degrees and extension of 0 degrees with pain 0/10   PT LONG TERM GOAL #3   Title Patient will demonstrate the ability to perform full depth squat to floor with return to stand with good mechanics and pain 0/10   Baseline Half squat per protocol   Status On-going   PT LONG TERM GOAL #4   Title Patient will demonstrate the ability to perform bilateral leg hop with distance at least 6 inches and pain 0/10, good mechanics without pain and adequate knee stability present   PT LONG TERM GOAL #5   Title Patient will demonstrate bilateral lower extremity strength 5/5 in all tested muscle groups in order to facilitate return to functional tasks and sports performance   Status On-going               Plan - 10/01/14 1019    Clinical Impression Statement 1 rep max complete with Lt quad at 77% compared to Rt. LE.  AROM 0-131 degrees.  Pt ready to progress to next phase, week 5.  Increased weight with SAQ and SLR for quad strengthening per PT plan last session.  No reports of pain through session, pt did c/o gastroc tightness during heel raises.   PT Next Visit Plan Next session progress ACL progress week 6.  Continue functional stretching and strengthening.  Pt will then be ready for progression of proprioception, strength and stability        Problem List Patient Active Problem List   Diagnosis Date Noted  . S/P ACL reconstruction 03/02/2011  . Knee pain 03/02/2011  . Knee stiffness 03/02/2011   Ihor Austin, [place]; [place] #[id number] [phone]  Aldona Lento 10/01/2014, 12:08 PM  [place] [address] [place], [place], [address] Phone: [phone]   Fax:  [phone]

## 2014-10-02 ENCOUNTER — Ambulatory Visit (HOSPITAL_COMMUNITY): Payer: BLUE CROSS/BLUE SHIELD

## 2014-10-04 ENCOUNTER — Ambulatory Visit (HOSPITAL_COMMUNITY): Payer: BLUE CROSS/BLUE SHIELD

## 2014-10-04 DIAGNOSIS — R531 Weakness: Secondary | ICD-10-CM

## 2014-10-04 DIAGNOSIS — R269 Unspecified abnormalities of gait and mobility: Secondary | ICD-10-CM

## 2014-10-04 DIAGNOSIS — Z4789 Encounter for other orthopedic aftercare: Secondary | ICD-10-CM | POA: Diagnosis not present

## 2014-10-04 DIAGNOSIS — Z7409 Other reduced mobility: Secondary | ICD-10-CM

## 2014-10-04 DIAGNOSIS — M24662 Ankylosis, left knee: Secondary | ICD-10-CM

## 2014-10-04 DIAGNOSIS — Z9889 Other specified postprocedural states: Secondary | ICD-10-CM

## 2014-10-04 DIAGNOSIS — R29898 Other symptoms and signs involving the musculoskeletal system: Secondary | ICD-10-CM

## 2014-10-04 NOTE — Therapy (Signed)
[place], [place], [address] Phone: [phone]   Fax:  [phone]  Physical Therapy Treatment  Patient Details  Name: Justin Macdonald MRN: [id number] Date of Birth: 1970/08/02 Referring Provider:  Elsie Saas, MD  Encounter Date: 10/04/2014      PT End of Session - 10/04/14 0939    Visit Number 10   Number of Visits 27   Date for PT Re-Evaluation 10/07/14   Authorization Type BCBS   Authorization Time Period 09/06/14 to 11/06/14   Authorization - Visit Number 10   Authorization - Number of Visits 30   PT Start Time 0932   PT Stop Time 1018   PT Time Calculation (min) 46 min   Activity Tolerance Patient tolerated treatment well   Behavior During Therapy [facility] for tasks assessed/performed      No past medical history on file.  No past surgical history on file.  There were no vitals filed for this visit.  Visit Diagnosis:  S/P ACL repair  Decreased range of motion of knee, left  Abnormality of gait  Decreased strength involving knee joint  Decreased strength, endurance, and mobility      Subjective Assessment - 10/04/14 0932    Subjective Feeling good, rode bicycle for 7 miles on Wednesday and continuing exercises daily with no problem   Currently in Pain? No/denies            [facility] PT Assessment - 10/04/14 0001    Assessment   Medical Diagnosis s/p L ACL repair with meniscus repair and repair of bone    Onset Date 08/21/14   Next MD Visit [facility] around 10/18/2014              [facility] Adult PT Treatment/Exercise - 10/04/14 0001    Exercises   Exercises Knee/Hip   Knee/Hip Exercises: Stretches   Active Hamstring Stretch 3 reps;30 seconds;Limitations   Active Hamstring Stretch Limitations 14in step 3 direction   Quad Stretch 3 reps;30 seconds   Quad Stretch Limitations prone with rope   Gastroc Stretch 3 reps;30 seconds   Gastroc Stretch Limitations slant board   Knee/Hip  Exercises: Aerobic   Elliptical 10:00 L3   Knee/Hip Exercises: Standing   Heel Raises 1 set;10 reps   Heel Raises Limitations Up Bil, down Lt only for eccentric control   Lateral Step Up Left;2 sets;10 reps   Lateral Step Up Limitations 7in   Forward Step Up Left;2 sets;10 reps   Forward Step Up Limitations 7in step   Step Down Left;5 reps;10 reps;Hand Hold: 1;Step Height: 4"   Step Down Limitations Common single leg balance reach 5 reps;    Functional Squat 10 reps;2 sets   Functional Squat Limitations lt only    Wall Squat 3 sets   Wall Squat Limitations (45-60 degrees) 30 second holds   Walking with Sports Cord Forward 3RT, retro 1 RT, side step 1RT                  PT Short Term Goals - 10/04/14 1012    PT SHORT TERM GOAL #1   Title Patient will demonstrate muscle strength of at least 4-/5 in L leg in order to improve knee stability and reduce discomfort during functional tasks   Status On-going   PT SHORT TERM GOAL #2   Title Patient will demonstrate R knee flexion of at least 115 degrees with minimal discomfort in order to enhance functional activity performance  Status Achieved   PT SHORT TERM GOAL #3   Title Patient will demonstrate the ability to ambulate unlimited distances with no AD, equal step lengths, minimal fatigue, and good mechanics throughout    Status On-going   PT SHORT TERM GOAL #4   Title Patient will demonstrate the ability to safely and efficiently perform appropriate exercises regimens at home, within ACL precautions, in order to maintain personal fitness and functional task performance   Status On-going   PT SHORT TERM GOAL #5   Title Patient will demonstrate the ability to safely and efficiently perform appropriate HEP with independence   Status Achieved           PT Long Term Goals - 10/04/14 1013    PT LONG TERM GOAL #1   Title Patient to be independent in advanced HEP    Status On-going   PT LONG TERM GOAL #2   Title Patient will  demonstrate active L knee flexion of at least 135 degrees and extension of 0 degrees with pain 0/10   Status On-going   PT LONG TERM GOAL #3   Title Patient will demonstrate the ability to perform full depth squat to floor with return to stand with good mechanics and pain 0/10   Baseline Half squat per protocol   Status On-going   PT LONG TERM GOAL #4   Title Patient will demonstrate the ability to perform bilateral leg hop with distance at least 6 inches and pain 0/10, good mechanics without pain and adequate knee stability present   PT LONG TERM GOAL #5   Title Patient will demonstrate bilateral lower extremity strength 5/5 in all tested muscle groups in order to facilitate return to functional tasks and sports performance   Status On-going               Plan - 10/04/14 1002    Clinical Impression Statement Unable to find protocol handout for week 6, called office twice requesting protocol faxed.  Continued PT session based on copy of [facility]'s ACL protocol not specific for pt though.  Progressed to step down training with no difficulty.  Began single leg balance reach to improve eccentric control and began sports cord and wall sits for hamstrings and glut strengthening for protocol.  No reports of pain through session.   PT Next Visit Plan Next session progress ACL progress week 7.  Continue functional stretching and strengthening.  Pt will then be ready for progression of proprioception, strength and stability. Next session begin fitter slides, initial lunges and increase step down height.  Awaiting pt.'s specific protocol from MD's office.          Problem List Patient Active Problem List   Diagnosis Date Noted  . S/P ACL reconstruction 03/02/2011  . Knee pain 03/02/2011  . Knee stiffness 03/02/2011   Ihor Austin, [place]; [place] #[id number] [phone]  Aldona Lento 10/04/2014, 10:13 AM  [place], [place], [address] Phone: [phone]   Fax:  [phone]

## 2014-10-07 ENCOUNTER — Encounter (HOSPITAL_COMMUNITY): Payer: BLUE CROSS/BLUE SHIELD | Admitting: Physical Therapy

## 2014-10-08 ENCOUNTER — Telehealth (HOSPITAL_COMMUNITY): Payer: Self-pay | Admitting: Physical Therapy

## 2014-10-10 ENCOUNTER — Ambulatory Visit (HOSPITAL_COMMUNITY): Payer: BLUE CROSS/BLUE SHIELD | Attending: Orthopedic Surgery | Admitting: Physical Therapy

## 2014-10-10 DIAGNOSIS — R269 Unspecified abnormalities of gait and mobility: Secondary | ICD-10-CM

## 2014-10-10 DIAGNOSIS — R6889 Other general symptoms and signs: Secondary | ICD-10-CM

## 2014-10-10 DIAGNOSIS — R29898 Other symptoms and signs involving the musculoskeletal system: Secondary | ICD-10-CM

## 2014-10-10 DIAGNOSIS — Z7409 Other reduced mobility: Secondary | ICD-10-CM | POA: Insufficient documentation

## 2014-10-10 DIAGNOSIS — Z4789 Encounter for other orthopedic aftercare: Secondary | ICD-10-CM | POA: Insufficient documentation

## 2014-10-10 DIAGNOSIS — M25662 Stiffness of left knee, not elsewhere classified: Secondary | ICD-10-CM | POA: Insufficient documentation

## 2014-10-10 DIAGNOSIS — Z9889 Other specified postprocedural states: Secondary | ICD-10-CM | POA: Insufficient documentation

## 2014-10-10 DIAGNOSIS — R531 Weakness: Secondary | ICD-10-CM

## 2014-10-10 DIAGNOSIS — M24662 Ankylosis, left knee: Secondary | ICD-10-CM

## 2014-10-10 NOTE — Therapy (Signed)
[place], [place], [address] Phone: [phone]   Fax:  [phone]  Physical Therapy Treatment  Patient Details  Name: Justin Macdonald MRN: [id number] Date of Birth: Aug 07, 1970 Referring Provider:  Elsie Saas, MD  Encounter Date: 10/10/2014      PT End of Session - 10/10/14 1004    Visit Number 11   Number of Visits 27   Date for PT Re-Evaluation 10/17/14   Authorization Type BCBS   Authorization Time Period 09/06/14 to 11/06/14  (protocol is on Amy's desk)   Authorization - Visit Number 11   Authorization - Number of Visits 30   PT Start Time 0848   PT Stop Time 0940   PT Time Calculation (min) 52 min   Activity Tolerance Patient tolerated treatment well   Behavior During Therapy [facility] for tasks assessed/performed      No past medical history on file.  No past surgical history on file.  There were no vitals filed for this visit.  Visit Diagnosis:  S/P ACL repair  Decreased range of motion of knee, left  Abnormality of gait  Decreased strength involving knee joint  Decreased strength, endurance, and mobility      Subjective Assessment - 10/10/14 0939    Subjective Pt states he biked 9 miles on Monday and 10 miles on Tuesday.  Admits to not doing his other exercises as he should.  States he continues to have patellar tendon irritation bilaterally.   Currently in Pain? No/denies                         [facility] Adult PT Treatment/Exercise - 10/10/14 0847    Knee/Hip Exercises: Stretches   Active Hamstring Stretch 3 reps;30 seconds;Limitations   Active Hamstring Stretch Limitations 14in step 3 direction   Gastroc Stretch 3 reps;30 seconds   Gastroc Stretch Limitations slant board   Knee/Hip Exercises: Aerobic   Elliptical 10:00 L4   Knee/Hip Exercises: Machines for Strengthening   Cybex Leg Press power tower 33 degrees single leg squats 2X10 reps singles bilaterally   Knee/Hip Exercises: Standing   Heel Raises 2 sets;10 reps   Heel Raises Limitations singles bilaterally   Rocker Board Limitations 1" each on foam no HHA   Other Standing Knee Exercises lunge matrix forward reach with anterior 10reps each LE (facilitation of talus for rotation with Lt LE)   Other Standing Knee Exercises squat matrix 10X 3" weights, squats on bosu 10X ea feet neutral, ER, IR                  PT Short Term Goals - 10/04/14 1012    PT SHORT TERM GOAL #1   Title Patient will demonstrate muscle strength of at least 4-/5 in L leg in order to improve knee stability and reduce discomfort during functional tasks   Status On-going   PT SHORT TERM GOAL #2   Title Patient will demonstrate R knee flexion of at least 115 degrees with minimal discomfort in order to enhance functional activity performance   Status Achieved   PT SHORT TERM GOAL #3   Title Patient will demonstrate the ability to ambulate unlimited distances with no AD, equal step lengths, minimal fatigue, and good mechanics throughout    Status On-going   PT SHORT TERM GOAL #4   Title Patient will demonstrate the ability to safely and efficiently perform appropriate exercises regimens at home, within ACL precautions,  in order to maintain personal fitness and functional task performance   Status On-going   PT SHORT TERM GOAL #5   Title Patient will demonstrate the ability to safely and efficiently perform appropriate HEP with independence   Status Achieved           PT Long Term Goals - 10/04/14 1013    PT LONG TERM GOAL #1   Title Patient to be independent in advanced HEP    Status On-going   PT LONG TERM GOAL #2   Title Patient will demonstrate active L knee flexion of at least 135 degrees and extension of 0 degrees with pain 0/10   Status On-going   PT LONG TERM GOAL #3   Title Patient will demonstrate the ability to perform full depth squat to floor with return to stand with good mechanics and pain  0/10   Baseline Half squat per protocol   Status On-going   PT LONG TERM GOAL #4   Title Patient will demonstrate the ability to perform bilateral leg hop with distance at least 6 inches and pain 0/10, good mechanics without pain and adequate knee stability present   PT LONG TERM GOAL #5   Title Patient will demonstrate bilateral lower extremity strength 5/5 in all tested muscle groups in order to facilitate return to functional tasks and sports performance   Status On-going               Plan - 10/10/14 0953    Clinical Impression Statement Advanced protocol received.  Consulted with PT (Cash Dewitt) and reviewed protocol for proper advancement of therex.  Pt never fully regained strength in his Rt LE following past 2 ACL repairs.  Pt will need to increase functional strength and stabilty of bilateral LE's before beginning plyometric activity.  Progressed with lunge and squat matrix with focus on fatiguing the muscle via multiple sets.  PT with noted fatigue at EOS.  No pain reported but with noted instability in quads, Lt>RT.Marland Kitchen   PT Next Visit Plan Next session progress ACL progress week 8.  Continue functional stretching and strengthening with eventual progression to plyometrics (week 10 per protocol if ready). Next session begin begin biodex isokinetic 90-40 degrees at 120-240 degrees/second.  Also resume multi-plane step ups/downs with bilateral LE's.            Problem List Patient Active Problem List   Diagnosis Date Noted  . S/P ACL reconstruction 03/02/2011  . Knee pain 03/02/2011  . Knee stiffness 03/02/2011    Teena Irani, PTA/CLT [phone]  10/10/2014, 10:08 AM  [place], [place], [address] Phone: [phone]   Fax:  [phone]

## 2014-10-15 ENCOUNTER — Ambulatory Visit (HOSPITAL_COMMUNITY): Payer: BLUE CROSS/BLUE SHIELD

## 2014-10-15 DIAGNOSIS — Z9889 Other specified postprocedural states: Secondary | ICD-10-CM

## 2014-10-15 DIAGNOSIS — R29898 Other symptoms and signs involving the musculoskeletal system: Secondary | ICD-10-CM

## 2014-10-15 DIAGNOSIS — R6889 Other general symptoms and signs: Secondary | ICD-10-CM

## 2014-10-15 DIAGNOSIS — Z4789 Encounter for other orthopedic aftercare: Secondary | ICD-10-CM | POA: Diagnosis not present

## 2014-10-15 DIAGNOSIS — R531 Weakness: Secondary | ICD-10-CM

## 2014-10-15 DIAGNOSIS — R269 Unspecified abnormalities of gait and mobility: Secondary | ICD-10-CM

## 2014-10-15 DIAGNOSIS — Z7409 Other reduced mobility: Secondary | ICD-10-CM

## 2014-10-15 DIAGNOSIS — M24662 Ankylosis, left knee: Secondary | ICD-10-CM

## 2014-10-15 NOTE — Therapy (Signed)
[place], [place], [address] Phone: [phone]   Fax:  [phone]  Physical Therapy Treatment  Patient Details  Name: Justin Macdonald MRN: [id number] Date of Birth: [birth year] Referring Provider:  Elsie Saas, MD  Encounter Date: 10/15/2014      PT End of Session - 10/15/14 0843    Visit Number 12   Number of Visits 27   Date for PT Re-Evaluation 10/17/14   Authorization Type BCBS   Authorization Time Period 09/06/14 to 11/06/14  (protocol is on Amy's desk)   Authorization - Visit Number 12   Authorization - Number of Visits 30   PT Start Time 0800   PT Stop Time 0854   PT Time Calculation (min) 54 min   Activity Tolerance Patient tolerated treatment well   Behavior During Therapy [facility] for tasks assessed/performed      No past medical history on file.  No past surgical history on file.  There were no vitals filed for this visit.  Visit Diagnosis:  S/P ACL repair  Decreased range of motion of knee, left  Abnormality of gait  Decreased strength involving knee joint  Decreased strength, endurance, and mobility      Subjective Assessment - 10/15/14 0810    Subjective Pt stated knee is feeling good today, no c/o pain   Currently in Pain? No/denies            [facility] PT Assessment - 10/15/14 0001    Assessment   Medical Diagnosis s/p L ACL repair with meniscus repair and repair of bone    Onset Date 08/21/14   Next MD Visit [place] around 10/21/2014              [facility] Adult PT Treatment/Exercise - 10/15/14 0001    Exercises   Exercises Knee/Hip   Knee/Hip Exercises: Stretches   Active Hamstring Stretch 3 reps;30 seconds;Limitations   Active Hamstring Stretch Limitations 14in step 3 direction   Gastroc Stretch 3 reps;30 seconds   Gastroc Stretch Limitations slant board   Knee/Hip Exercises: Aerobic   Elliptical 10:00 L4   Isokinetic 90-40 degrees, 210, 180, 150 2x 10   Knee/Hip Exercises: Machines for Strengthening   Cybex Leg Press power tower 33 degrees single leg squats 15x reps singles bilaterally   Knee/Hip Exercises: Standing   Heel Raises 2 sets;10 reps   Heel Raises Limitations Up Bil, down Lt LE    Lateral Step Up 15 reps   Lateral Step Up Limitations 7in   Forward Step Up 15 reps   Forward Step Up Limitations 7in step   Step Down 5 reps   Step Down Limitations Common single leg balance reach 5 reps;    Walking with Sports Cord Forward 3RT, retro 1 RT, side step 1RT   Other Standing Knee Exercises lunge matrix forward reach with anterior 10reps each LE (facilitation of talus for rotation with Lt LE)   Other Standing Knee Exercises squat matrix 10X 3# weights, squats on bosu 10X ea feet neutral, ER, IR, Split stance Lt forward             PT Short Term Goals - 10/15/14 0849    PT SHORT TERM GOAL #1   Title Patient will demonstrate muscle strength of at least 4-/5 in L leg in order to improve knee stability and reduce discomfort during functional tasks   Status On-going   PT SHORT TERM GOAL #2   Title Patient will demonstrate  R knee flexion of at least 115 degrees with minimal discomfort in order to enhance functional activity performance   Status Achieved   PT SHORT TERM GOAL #3   Title Patient will demonstrate the ability to ambulate unlimited distances with no AD, equal step lengths, minimal fatigue, and good mechanics throughout    Status On-going   PT SHORT TERM GOAL #4   Title Patient will demonstrate the ability to safely and efficiently perform appropriate exercises regimens at home, within ACL precautions, in order to maintain personal fitness and functional task performance   Status On-going   PT SHORT TERM GOAL #5   Title Patient will demonstrate the ability to safely and efficiently perform appropriate HEP with independence   Status Achieved           PT Long Term Goals - 10/15/14 0849    PT LONG TERM GOAL #1   Title  Patient to be independent in advanced HEP    PT LONG TERM GOAL #2   Title Patient will demonstrate active L knee flexion of at least 135 degrees and extension of 0 degrees with pain 0/10   PT LONG TERM GOAL #3   Title Patient will demonstrate the ability to perform full depth squat to floor with return to stand with good mechanics and pain 0/10   PT LONG TERM GOAL #4   Title Patient will demonstrate the ability to perform bilateral leg hop with distance at least 6 inches and pain 0/10, good mechanics without pain and adequate knee stability present   PT LONG TERM GOAL #5   Title Patient will demonstrate bilateral lower extremity strength 5/5 in all tested muscle groups in order to facilitate return to functional tasks and sports performance               Plan - 10/15/14 0844    Clinical Impression Statement Pt at 8 week ACL protocol.  Began biodex isokinetic at 90-40 degrees at 210, 180 and 150 degrees/second for quad strengthening.  Resumed stair training for functional strengthening with noted weak eccentric quad control, continued common single leg balance reach.  No repoorts of pain through session.   PT Next Visit Plan Next session progress ACL progress week 8.  Continue functional stretching and strengthening with eventual progression to plyometrics (week 10 per protocol if ready). Continue biodex isokinetic 90-40 degrees at 120-240 degrees/second and multi-plane step ups/downs with bilateral LE's.            Problem List Patient Active Problem List   Diagnosis Date Noted  . S/P ACL reconstruction 03/02/2011  . Knee pain 03/02/2011  . Knee stiffness 03/02/2011   Ihor Austin, [place]; [place] #[id number] [phone]  Aldona Lento 10/15/2014, 8:50 AM  [place], [place], [address] Phone: [phone]   Fax:  [phone]

## 2014-10-17 ENCOUNTER — Ambulatory Visit (HOSPITAL_COMMUNITY): Payer: BLUE CROSS/BLUE SHIELD | Admitting: Physical Therapy

## 2014-10-17 DIAGNOSIS — Z7409 Other reduced mobility: Secondary | ICD-10-CM

## 2014-10-17 DIAGNOSIS — Z9889 Other specified postprocedural states: Secondary | ICD-10-CM

## 2014-10-17 DIAGNOSIS — R29898 Other symptoms and signs involving the musculoskeletal system: Secondary | ICD-10-CM

## 2014-10-17 DIAGNOSIS — Z4789 Encounter for other orthopedic aftercare: Secondary | ICD-10-CM | POA: Diagnosis not present

## 2014-10-17 DIAGNOSIS — R531 Weakness: Secondary | ICD-10-CM

## 2014-10-17 DIAGNOSIS — M24662 Ankylosis, left knee: Secondary | ICD-10-CM

## 2014-10-17 DIAGNOSIS — R6889 Other general symptoms and signs: Secondary | ICD-10-CM

## 2014-10-17 DIAGNOSIS — R269 Unspecified abnormalities of gait and mobility: Secondary | ICD-10-CM

## 2014-10-17 NOTE — Therapy (Signed)
[place], [place], [address] Phone: [phone]   Fax:  [phone]  Physical Therapy Treatment (Re-Assessment)  Patient Details  Name: Justin Macdonald MRN: [id number] Date of Birth: [birth year] Referring Provider:  Elsie Saas, MD  Encounter Date: 10/17/2014      PT End of Session - 10/17/14 0854    Visit Number 13   Number of Visits 27   Date for PT Re-Evaluation 11/14/14   Authorization Type BCBS   Authorization Time Period 09/06/14 to 11/06/14  (protocol is on Amy's desk)   Authorization - Visit Number 13   Authorization - Number of Visits 30   PT Start Time 0801   PT Stop Time 0843   PT Time Calculation (min) 42 min   Activity Tolerance Patient tolerated treatment well   Behavior During Therapy [facility] for tasks assessed/performed      No past medical history on file.  No past surgical history on file.  There were no vitals filed for this visit.  Visit Diagnosis:  S/P ACL repair  Decreased range of motion of knee, left  Abnormality of gait  Decreased strength involving knee joint  Decreased strength, endurance, and mobility      Subjective Assessment - 10/17/14 0828    Subjective Patient states he continues to be pain free, has continued to ride bike approx 10 miles with no issues   Pertinent History Patient has had surgery on his opposite knee in the past for ACL repair. When he was [place] his other knee, he did something to his L knee and it stayed swollen for years; about 6 months ago, knee began falling out of joint. ACL, mensiscus, and bone repair done  on 08/21/14.    How long can you sit comfortably? No limits   How long can you stand comfortably? No limits   How long can you walk comfortably? At least a mile    Patient Stated Goals get back to everything   Currently in Pain? No/denies            [facility] PT Assessment - 10/17/14 0001    Observation/Other Assessments   Focus  on Therapeutic Outcomes (FOTO)  22% limited    AROM   Right Hip External Rotation  45   Right Hip Internal Rotation  34   Left Hip External Rotation  45   Left Hip Internal Rotation  37   Left Knee Extension 1   Left Knee Flexion 130   Strength   Right Hip Flexion 5/5   Right Hip Extension 5/5   Right Hip ABduction 5/5   Left Hip Flexion 5/5   Left Hip Extension 5/5   Left Hip ABduction 5/5   Right Knee Flexion 5/5   Right Knee Extension 5/5   Left Knee Flexion 4/5   Left Knee Extension 4+/5   Right Ankle Dorsiflexion 5/5   Left Ankle Dorsiflexion 5/5                     [facility] Adult PT Treatment/Exercise - 10/17/14 0001    Knee/Hip Exercises: Aerobic   Elliptical 10 minutes L5   Knee/Hip Exercises: Standing   Heel Raises 2 sets;15 reps   Heel Raises Limitations Up Bil, down Lt LE    Lateral Step Up 15 reps   Lateral Step Up Limitations 7in   Forward Step Up 15 reps   Forward Step Up Limitations 7in step   Walking with  Sports Cord Forward 3RT                 PT Education - 10/17/14 [id number]    Education provided Yes   Education Details Prognosis, overall plan of care moving forward, goals    Person(s) Educated Patient   Methods Explanation   Comprehension Verbalized understanding          PT Short Term Goals - 10/17/14 0900    PT SHORT TERM GOAL #1   Title Patient will demonstrate muscle strength of at least 4-/5 in L leg in order to improve knee stability and reduce discomfort during functional tasks   Baseline 5/12- at worst 4/5 strength L    Time 6   Period Weeks   Status Achieved   PT SHORT TERM GOAL #2   Title Patient will demonstrate R knee flexion of at least 115 degrees with minimal discomfort in order to enhance functional activity performance   Baseline 09/18/2014 0-123   Time 6   Period Weeks   Status Achieved   PT SHORT TERM GOAL #3   Title Patient will demonstrate the ability to ambulate unlimited distances with no AD, equal  step lengths, minimal fatigue, and good mechanics throughout    Time 6   Period Weeks   Status Achieved   PT SHORT TERM GOAL #4   Title Patient will demonstrate the ability to safely and efficiently perform appropriate exercises regimens at home, within ACL precautions, in order to maintain personal fitness and functional task performance   Baseline 5/11- patient reports that he will occasionally take a day off after a bike ride but is otherwise doing very well with his personal fitness    Time 6   Period Weeks   Status Achieved   PT SHORT TERM GOAL #5   Title Patient will demonstrate the ability to safely and efficiently perform appropriate HEP with independence   Baseline 09/18/2014 Reports compliance with HEP and able to verbalize/demonstrate appropriate form with HEP   Time 6   Period Weeks   Status Achieved           PT Long Term Goals - 10/17/14 0901    PT LONG TERM GOAL #1   Title Patient to be independent in advanced HEP    Time 12   Status On-going   PT LONG TERM GOAL #2   Title Patient will demonstrate active L knee flexion of at least 135 degrees and extension of 0 degrees with pain 0/10   Time 12   Period Weeks   Status On-going   PT LONG TERM GOAL #3   Title Patient will demonstrate the ability to perform full depth squat to floor with return to stand with good mechanics and pain 0/10   Baseline Half squat per protocol   Time 12   Period Weeks   Status On-going   PT LONG TERM GOAL #4   Title Patient will demonstrate the ability to perform bilateral leg hop with distance at least 6 inches and pain 0/10, good mechanics without pain and adequate knee stability present   Time 12   Period Weeks   Status On-going   PT LONG TERM GOAL #5   Title Patient will demonstrate bilateral lower extremity strength 5/5 in all tested muscle groups in order to facilitate return to functional tasks and sports performance   Time 12   Period Weeks   Status On-going  Plan - 10/17/14 0855    Clinical Impression Statement Re-assessment performed today. Patient shows great improvement in knee range of motion as well as general strength, however does continue to display some weakness in hamstrings and quadriceps on affected knee. Patient also at this time has reduced ability to return to his normal sports and recreational activities due to ongoing weakness in his knee as well as reduced proprioception and reduced ability to perform dynamic sports drills such as full depth squats and tasks  involving challinging proprioceptive/balance activities. The patient will benefit from a continuation of skilled PT services for approximately 4-6 more weeks, to continue addressing his deficits and optimizing his overall level of function.    Pt will benefit from skilled therapeutic intervention in order to improve on the following deficits Abnormal gait;Decreased coordination;Decreased range of motion;Difficulty walking;Impaired flexibility;Decreased endurance;Increased edema;Decreased activity tolerance;Decreased knowledge of precautions;Decreased balance;Decreased mobility;Decreased strength   Rehab Potential Good   PT Frequency Other (comment)   PT Duration Other (comment)   PT Treatment/Interventions ADLs/Self Care Home Management;Gait training;Neuromuscular re-education;Stair training;Functional mobility training;Patient/family education;Passive range of motion;Cryotherapy;Therapeutic activities;Electrical Stimulation;Therapeutic exercise;Manual techniques;Balance training   PT Next Visit Plan Next session progress ACL progress week 8.  Continue functional stretching and strengthening with eventual progression to plyometrics (week 10 per protocol if ready). Continue biodex isokinetic 90-40 degrees at 120-240 degrees/second and multi-plane step ups/downs with bilateral LE's.       PT Home Exercise Plan Given for supine, prone and sidelying therex   Consulted  and Agree with Plan of Care Patient        Problem List Patient Active Problem List   Diagnosis Date Noted  . S/P ACL reconstruction 03/02/2011  . Knee pain 03/02/2011  . Knee stiffness 03/02/2011    Deniece Ree PT, DPT [place] [address] [place], [place], [address] Phone: [phone]   Fax:  [phone]

## 2014-10-21 ENCOUNTER — Ambulatory Visit (HOSPITAL_COMMUNITY): Payer: BLUE CROSS/BLUE SHIELD

## 2014-10-21 DIAGNOSIS — R531 Weakness: Secondary | ICD-10-CM

## 2014-10-21 DIAGNOSIS — Z4789 Encounter for other orthopedic aftercare: Secondary | ICD-10-CM | POA: Diagnosis not present

## 2014-10-21 DIAGNOSIS — R269 Unspecified abnormalities of gait and mobility: Secondary | ICD-10-CM

## 2014-10-21 DIAGNOSIS — Z9889 Other specified postprocedural states: Secondary | ICD-10-CM

## 2014-10-21 DIAGNOSIS — M24662 Ankylosis, left knee: Secondary | ICD-10-CM

## 2014-10-21 DIAGNOSIS — Z7409 Other reduced mobility: Secondary | ICD-10-CM

## 2014-10-21 DIAGNOSIS — R29898 Other symptoms and signs involving the musculoskeletal system: Secondary | ICD-10-CM

## 2014-10-21 NOTE — Therapy (Signed)
[place], [place], [address] Phone: [phone]   Fax:  [phone]  Physical Therapy Treatment  Patient Details  Name: Justin Macdonald MRN: [id number] Date of Birth: 1970/12/28 Referring Provider:  Monico Blitz, MD  Encounter Date: 10/21/2014      PT End of Session - 10/21/14 1738    Visit Number 14   Number of Visits 27   Date for PT Re-Evaluation 11/14/14   Authorization Type BCBS   Authorization Time Period 09/06/14 to 11/06/14  (protocol is on Amy's desk)   Authorization - Visit Number 14   Authorization - Number of Visits 30   PT Start Time 1937   PT Stop Time 1818   PT Time Calculation (min) 46 min   Activity Tolerance Patient tolerated treatment well   Behavior During Therapy [facility] for tasks assessed/performed      No past medical history on file.  No past surgical history on file.  There were no vitals filed for this visit.  Visit Diagnosis:  S/P ACL repair  Decreased range of motion of knee, left  Abnormality of gait  Decreased strength involving knee joint  Decreased strength, endurance, and mobility      Subjective Assessment - 10/21/14 1733    Subjective Pt stated he went to [place] today, RT[place] on Wednesday.  Reported he rode his bike for 12 miles this weekend with no problems.   Currently in Pain? No/denies            [facility] PT Assessment - 10/21/14 0001    Assessment   Medical Diagnosis s/p L ACL repair with meniscus repair and repair of bone    Onset Date 08/21/14   Next MD Visit Noemi [facility] around one month           [facility] Adult PT Treatment/Exercise - 10/21/14 0001    Exercises   Exercises Knee/Hip   Knee/Hip Exercises: Stretches   Gastroc Stretch 3 reps;30 seconds   Gastroc Stretch Limitations slant board   Knee/Hip Exercises: Aerobic   Elliptical 10 minutes L5   Isokinetic 90-40 degrees 180-150-120 2x 10   Knee/Hip Exercises: Machines for Strengthening   Cybex Leg  Press power tower 33 degrees single leg squats 15x reps singles bilaterally   Knee/Hip Exercises: Standing   Heel Raises 2 sets;15 reps   Heel Raises Limitations Unilateral   Lateral Step Up 15 reps   Lateral Step Up Limitations 7in   Forward Step Up 15 reps   Forward Step Up Limitations 7in step   Step Down 10 reps   Step Down Limitations Common single leg balance reach 5 reps; step down 7in   Walking with Sports Cord Forward 3RT; sidestepping 1RT   Other Standing Knee Exercises lunge matrix forward reach with anterior 10reps each LE (facilitation of talus for rotation with Lt LE)   Other Standing Knee Exercises SFT squats, squats on bosu 10X ea feet neutral, ER, IR, Split stance Lt forward              PT Short Term Goals - 10/21/14 1739    PT SHORT TERM GOAL #1   Title Patient will demonstrate muscle strength of at least 4-/5 in L leg in order to improve knee stability and reduce discomfort during functional tasks   Status Achieved   PT SHORT TERM GOAL #2   Title Patient will demonstrate R knee flexion of at least 115 degrees with minimal discomfort in order to enhance  functional activity performance   Status Achieved   PT SHORT TERM GOAL #3   Title Patient will demonstrate the ability to ambulate unlimited distances with no AD, equal step lengths, minimal fatigue, and good mechanics throughout    Status Achieved   PT SHORT TERM GOAL #4   Title Patient will demonstrate the ability to safely and efficiently perform appropriate exercises regimens at home, within ACL precautions, in order to maintain personal fitness and functional task performance   Status Achieved   PT SHORT TERM GOAL #5   Title Patient will demonstrate the ability to safely and efficiently perform appropriate HEP with independence   Status Achieved           PT Long Term Goals - 10/21/14 1739    PT LONG TERM GOAL #1   Title Patient to be independent in advanced HEP    Status On-going   PT LONG TERM  GOAL #2   Title Patient will demonstrate active L knee flexion of at least 135 degrees and extension of 0 degrees with pain 0/10   Status On-going   PT LONG TERM GOAL #3   Title Patient will demonstrate the ability to perform full depth squat to floor with return to stand with good mechanics and pain 0/10   Baseline Half squat per protocol   Status On-going   PT LONG TERM GOAL #4   Title Patient will demonstrate the ability to perform bilateral leg hop with distance at least 6 inches and pain 0/10, good mechanics without pain and adequate knee stability present   Status On-going   PT LONG TERM GOAL #5   Title Patient will demonstrate bilateral lower extremity strength 5/5 in all tested muscle groups in order to facilitate return to functional tasks and sports performance   Status On-going               Plan - 10/21/14 1755    Clinical Impression Statement Session focus on quad and hamstring strengthening per week 8-9 on MD's ACLprotocol.  Increase demand with biodex isokinetic to 180-150-120 degrees/second to further increase quad strengthening.  Pt continues to exhibit weakness with noted musculature fatiguewith SFT squats this session and descending stairs   PT Next Visit Plan Next session progress ACL progress week 9.  Continue functional stretching and strengthening with eventual progression to plyometrics (week 10 on 10/30/2014 per protocol if ready). Continue biodex isokinetic 90-40 degrees at 120-240 degrees/second and multi-plane step ups/downs with bilateral LE's.    Next session review climbing ladders for RTW safety.          Problem List Patient Active Problem List   Diagnosis Date Noted  . S/P ACL reconstruction 03/02/2011  . Knee pain 03/02/2011  . Knee stiffness 03/02/2011   Ihor Austin, [place]; [place] #[id number] [phone]  Aldona Lento 10/21/2014, 6:17 PM  [place] [address] [place], [place],  [address] Phone: [phone]   Fax:  [phone]

## 2014-10-22 ENCOUNTER — Encounter (HOSPITAL_COMMUNITY): Payer: BLUE CROSS/BLUE SHIELD | Admitting: Physical Therapy

## 2014-10-23 ENCOUNTER — Encounter (HOSPITAL_COMMUNITY): Payer: BLUE CROSS/BLUE SHIELD

## 2014-10-25 ENCOUNTER — Encounter (HOSPITAL_COMMUNITY): Payer: BLUE CROSS/BLUE SHIELD | Admitting: Physical Therapy

## 2014-10-28 ENCOUNTER — Encounter (HOSPITAL_COMMUNITY): Payer: BLUE CROSS/BLUE SHIELD | Admitting: Physical Therapy

## 2014-10-28 ENCOUNTER — Ambulatory Visit (HOSPITAL_COMMUNITY): Payer: BLUE CROSS/BLUE SHIELD

## 2014-10-30 ENCOUNTER — Encounter (HOSPITAL_COMMUNITY): Payer: BLUE CROSS/BLUE SHIELD | Admitting: Physical Therapy

## 2014-10-31 ENCOUNTER — Ambulatory Visit (HOSPITAL_COMMUNITY): Payer: BLUE CROSS/BLUE SHIELD | Admitting: Physical Therapy

## 2014-10-31 DIAGNOSIS — R6889 Other general symptoms and signs: Secondary | ICD-10-CM

## 2014-10-31 DIAGNOSIS — R531 Weakness: Secondary | ICD-10-CM

## 2014-10-31 DIAGNOSIS — R29898 Other symptoms and signs involving the musculoskeletal system: Secondary | ICD-10-CM

## 2014-10-31 DIAGNOSIS — R269 Unspecified abnormalities of gait and mobility: Secondary | ICD-10-CM

## 2014-10-31 DIAGNOSIS — Z7409 Other reduced mobility: Secondary | ICD-10-CM

## 2014-10-31 DIAGNOSIS — Z9889 Other specified postprocedural states: Secondary | ICD-10-CM

## 2014-10-31 DIAGNOSIS — M24662 Ankylosis, left knee: Secondary | ICD-10-CM

## 2014-10-31 DIAGNOSIS — Z4789 Encounter for other orthopedic aftercare: Secondary | ICD-10-CM | POA: Diagnosis not present

## 2014-10-31 NOTE — Therapy (Signed)
[place], [place], [address] Phone: [phone]   Fax:  [phone]  Physical Therapy Treatment  Patient Details  Name: Justin Macdonald MRN: [id number] Date of Birth: May 29, 1971 Referring Provider:  Monico Blitz, MD  Encounter Date: 10/31/2014      PT End of Session - 10/31/14 1730    Visit Number 15   Number of Visits 27   Date for PT Re-Evaluation 11/14/14   Authorization Type BCBS   Authorization Time Period 09/06/14 to 11/06/14  (protocol is on Amy's desk)   Authorization - Visit Number 15   Authorization - Number of Visits 30   PT Start Time 1062   PT Stop Time 1740   PT Time Calculation (min) 54 min   Activity Tolerance Patient tolerated treatment well   Behavior During Therapy [facility] for tasks assessed/performed      No past medical history on file.  No past surgical history on file.  There were no vitals filed for this visit.  Visit Diagnosis:  S/P ACL repair  Decreased range of motion of knee, left  Abnormality of gait  Decreased strength involving knee joint  Decreased strength, endurance, and mobility      Subjective Assessment - 10/31/14 1649    Subjective No reports of pain, stated compression socks help with swelling with RTW for long periods of time.     Currently in Pain? No/denies            [facility] PT Assessment - 10/31/14 0001    Assessment   Medical Diagnosis s/p L ACL repair with meniscus repair and repair of bone    Onset Date/Surgical Date 08/21/14   Next MD Visit Noemi [facility] around one month   Functional Tests   Functional tests Single Leg Squat   Single Leg Squat   Comments Rt 105 degrees, Lt 110 degrees               Exercise instructed by Devona Konig, PT, Amy Mare Ferrari PTA and Ihor Austin PTA      [facility] Adult PT Treatment/Exercise - 10/31/14 0001    Exercises   Exercises Knee/Hip   Knee/Hip Exercises: Stretches   Active Hamstring Stretch 3  reps;30 seconds;Limitations   Active Hamstring Stretch Limitations 14in step 3 direction   Gastroc Stretch 3 reps;30 seconds   Gastroc Stretch Limitations slant board   Knee/Hip Exercises: Aerobic   Elliptical 10 minutes L5   Knee/Hip Exercises: Plyometrics   Bilateral Jumping 5 sets   Bilateral Jumping Limitations hop matrix   Other Plyometric Exercises agility ladder drills 10 minutes   Knee/Hip Exercises: Standing   Lateral Step Up 15 reps   Lateral Step Up Limitations 7in   Forward Step Up 15 reps   Forward Step Up Limitations 7in step   Step Down 10 reps   Step Down Limitations Common single leg balance reach 5 reps; step down 7in   Other Standing Knee Exercises lunge matrix forward reach with anterior 10reps each LE (facilitation of talus for rotation with Lt LE)                  PT Short Term Goals - 10/21/14 1739    PT SHORT TERM GOAL #1   Title Patient will demonstrate muscle strength of at least 4-/5 in L leg in order to improve knee stability and reduce discomfort during functional tasks   Status Achieved   PT SHORT TERM GOAL #2   Title  Patient will demonstrate R knee flexion of at least 115 degrees with minimal discomfort in order to enhance functional activity performance   Status Achieved   PT SHORT TERM GOAL #3   Title Patient will demonstrate the ability to ambulate unlimited distances with no AD, equal step lengths, minimal fatigue, and good mechanics throughout    Status Achieved   PT SHORT TERM GOAL #4   Title Patient will demonstrate the ability to safely and efficiently perform appropriate exercises regimens at home, within ACL precautions, in order to maintain personal fitness and functional task performance   Status Achieved   PT SHORT TERM GOAL #5   Title Patient will demonstrate the ability to safely and efficiently perform appropriate HEP with independence   Status Achieved           PT Long Term Goals - 10/21/14 1739    PT LONG TERM  GOAL #1   Title Patient to be independent in advanced HEP    Status On-going   PT LONG TERM GOAL #2   Title Patient will demonstrate active L knee flexion of at least 135 degrees and extension of 0 degrees with pain 0/10   Status On-going   PT LONG TERM GOAL #3   Title Patient will demonstrate the ability to perform full depth squat to floor with return to stand with good mechanics and pain 0/10   Baseline Half squat per protocol   Status On-going   PT LONG TERM GOAL #4   Title Patient will demonstrate the ability to perform bilateral leg hop with distance at least 6 inches and pain 0/10, good mechanics without pain and adequate knee stability present   Status On-going   PT LONG TERM GOAL #5   Title Patient will demonstrate bilateral lower extremity strength 5/5 in all tested muscle groups in order to facilitate return to functional tasks and sports performance   Status On-going               Plan - 10/31/14 1731    Clinical Impression Statement Single leg squat testing complete prior progressing to plyometrics with abilty to single leg squat 105 degrees for Rt and 110 degrees with Lt LE, able to complete 20 squats in 42 seconds on Rt and 39 second on Lt. Pt ready to begin plyometric activities bilaterally.   Began agility ladder to improve coordination and squat matrix to increase power. Pt with difficutly coordinating activities at at slower velocity.   Pt with noted instability with single leg squats requiring more islolated strengthening and plyometric actvities.      PT Next Visit Plan Progress per ACL protocol per week 10.  Add power ups with hops, single leg lunge hops and single leg squat reach matrix (with opposite LE retro in chair) using 5# weights.  Pt can now complete isokinetic work in full ROM.  Progress to full strength bilaterally.          Problem List Patient Active Problem List   Diagnosis Date Noted  . S/P ACL reconstruction 03/02/2011  . Knee pain  03/02/2011  . Knee stiffness 03/02/2011    Aldona Lento, PTA 10/31/2014, 5:57 PM  [place] [address] [place], [place], [address] Phone: [phone]   Fax:  [phone]

## 2014-11-01 ENCOUNTER — Encounter (HOSPITAL_COMMUNITY): Payer: BLUE CROSS/BLUE SHIELD

## 2014-11-05 ENCOUNTER — Encounter (HOSPITAL_COMMUNITY): Payer: BLUE CROSS/BLUE SHIELD | Admitting: Physical Therapy

## 2014-11-06 ENCOUNTER — Ambulatory Visit (HOSPITAL_COMMUNITY): Payer: BLUE CROSS/BLUE SHIELD | Attending: Orthopedic Surgery

## 2014-11-06 DIAGNOSIS — R6889 Other general symptoms and signs: Secondary | ICD-10-CM

## 2014-11-06 DIAGNOSIS — Z4789 Encounter for other orthopedic aftercare: Secondary | ICD-10-CM | POA: Diagnosis present

## 2014-11-06 DIAGNOSIS — R29898 Other symptoms and signs involving the musculoskeletal system: Secondary | ICD-10-CM | POA: Diagnosis not present

## 2014-11-06 DIAGNOSIS — Z7409 Other reduced mobility: Secondary | ICD-10-CM | POA: Insufficient documentation

## 2014-11-06 DIAGNOSIS — Z9889 Other specified postprocedural states: Secondary | ICD-10-CM

## 2014-11-06 DIAGNOSIS — M24662 Ankylosis, left knee: Secondary | ICD-10-CM

## 2014-11-06 DIAGNOSIS — R269 Unspecified abnormalities of gait and mobility: Secondary | ICD-10-CM | POA: Diagnosis not present

## 2014-11-06 DIAGNOSIS — R531 Weakness: Secondary | ICD-10-CM

## 2014-11-06 DIAGNOSIS — M25662 Stiffness of left knee, not elsewhere classified: Secondary | ICD-10-CM | POA: Insufficient documentation

## 2014-11-06 NOTE — Therapy (Signed)
[place], [place], [address] Phone: [phone]   Fax:  [phone]  Physical Therapy Treatment  Patient Details  Name: Justin Macdonald MRN: [id number] Date of Birth: [birth year] Referring Provider:  Monico Blitz, MD  Encounter Date: 11/06/2014      PT End of Session - 11/06/14 1651    Visit Number 16   Number of Visits 27   Date for PT Re-Evaluation 11/14/14   Authorization Type BCBS   Authorization Time Period 09/06/14 to 11/06/14  (protocol is on Amy's desk)   Authorization - Visit Number 16   Authorization - Number of Visits 30   PT Start Time 1648   PT Stop Time 1734   PT Time Calculation (min) 46 min   Activity Tolerance Patient tolerated treatment well   Behavior During Therapy [facility] for tasks assessed/performed      No past medical history on file.  No past surgical history on file.  There were no vitals filed for this visit.  Visit Diagnosis:  S/P ACL repair  Decreased range of motion of knee, left  Abnormality of gait  Decreased strength involving knee joint  Decreased strength, endurance, and mobility      Subjective Assessment - 11/06/14 1650    Subjective No reports of pain, stated Bil feet swollen at night.  Stated he has been to [facility] earlier today.   Currently in Pain? No/denies                         [facility] Adult PT Treatment/Exercise - 11/06/14 0001    Exercises   Exercises Knee/Hip   Knee/Hip Exercises: Stretches   Active Hamstring Stretch 3 reps;30 seconds;Limitations   Active Hamstring Stretch Limitations 14in step 3 direction   Gastroc Stretch 3 reps;30 seconds   Gastroc Stretch Limitations slant board   Knee/Hip Exercises: Aerobic   Elliptical 10 minutes L5   Isokinetic Full range 150-120-90 2x 10   Knee/Hip Exercises: Plyometrics   Bilateral Jumping 5 sets   Bilateral Jumping Limitations hop matrix   Other Plyometric Exercises agility ladder  drills 10 minutes   Knee/Hip Exercises: Standing   Forward Lunges Both;10 reps   Forward Lunges Limitations single leg lunge hops   Lateral Step Up Left;15 reps;Hand Hold: 0;Step Height: 6"   Lateral Step Up Limitations power up with hop   Forward Step Up Left;15 reps;Hand Hold: 0;Step Height: 6"   Forward Step Up Limitations power up and hop   Functional Squat 2 sets;10 reps   Functional Squat Limitations Bil LE single leg squat matrix with opposite LE on chair 5#                PT Education - 11/06/14 2022    Education provided Yes   Education Details Educated on edema reduction techniques   Person(s) Educated Patient   Methods Explanation   Comprehension Verbalized understanding          PT Short Term Goals - 11/06/14 2030    PT SHORT TERM GOAL #1   Title Patient will demonstrate muscle strength of at least 4-/5 in L leg in order to improve knee stability and reduce discomfort during functional tasks   Status Achieved   PT SHORT TERM GOAL #2   Title Patient will demonstrate R knee flexion of at least 115 degrees with minimal discomfort in order to enhance functional activity performance   Status Achieved   PT  SHORT TERM GOAL #3   Title Patient will demonstrate the ability to ambulate unlimited distances with no AD, equal step lengths, minimal fatigue, and good mechanics throughout    Status Achieved   PT SHORT TERM GOAL #4   Title Patient will demonstrate the ability to safely and efficiently perform appropriate exercises regimens at home, within ACL precautions, in order to maintain personal fitness and functional task performance   Status Achieved   PT SHORT TERM GOAL #5   Title Patient will demonstrate the ability to safely and efficiently perform appropriate HEP with independence   Status Achieved           PT Long Term Goals - 11/06/14 2030    PT LONG TERM GOAL #1   Title Patient to be independent in advanced HEP    Status On-going   PT LONG TERM GOAL  #2   Title Patient will demonstrate active L knee flexion of at least 135 degrees and extension of 0 degrees with pain 0/10   Status On-going   PT LONG TERM GOAL #3   Title Patient will demonstrate the ability to perform full depth squat to floor with return to stand with good mechanics and pain 0/10   Status On-going   PT LONG TERM GOAL #4   Title Patient will demonstrate the ability to perform bilateral leg hop with distance at least 6 inches and pain 0/10, good mechanics without pain and adequate knee stability present   Status On-going   PT LONG TERM GOAL #5   Title Patient will demonstrate bilateral lower extremity strength 5/5 in all tested muscle groups in order to facilitate return to functional tasks and sports performance   Status On-going               Plan - 11/06/14 2023    Clinical Impression Statement Progressed per ACL protocol at 11 weeks with increased Bil LE plyometric activities inclues power ups, single leg lunge hops and single leg squat for quad, hamstring and gluteal region.  Isometrics complete this session with full ROM.  Noted difficult coordinaiton with agility ladder and plyometric activities as well as instabilty with single leg activities due to weakness.  No reports of pain through session.     PT Next Visit Plan Progress per ACL protocol per week 11.  Continue with power ups with hops, single leg lunge hops and single leg squat reach matrix (with opposite LE retro in chair) using 5# weights.  Pt can now complete isokinetic work in full ROM.  Progress to full strength bilaterally.  Progress to running program when able.          Problem List Patient Active Problem List   Diagnosis Date Noted  . S/P ACL reconstruction 03/02/2011  . Knee pain 03/02/2011  . Knee stiffness 03/02/2011   .[email or web address]  Aldona Lento 11/06/2014, 8:32 PM  [place] [address] [place], [place], [address] Phone: [phone]    Fax:  [phone]

## 2014-11-12 ENCOUNTER — Ambulatory Visit (HOSPITAL_COMMUNITY): Payer: BLUE CROSS/BLUE SHIELD | Admitting: Physical Therapy

## 2014-11-14 ENCOUNTER — Ambulatory Visit (HOSPITAL_COMMUNITY): Payer: BLUE CROSS/BLUE SHIELD

## 2014-11-14 ENCOUNTER — Telehealth (HOSPITAL_COMMUNITY): Payer: Self-pay

## 2014-11-14 NOTE — Telephone Encounter (Signed)
No show, called and pt. stated he was given a different schedule and not aware he had an apt. Today.   Aldona Lento, PTA

## 2014-11-27 ENCOUNTER — Ambulatory Visit (HOSPITAL_COMMUNITY): Payer: BLUE CROSS/BLUE SHIELD

## 2014-11-27 ENCOUNTER — Telehealth (HOSPITAL_COMMUNITY): Payer: Self-pay

## 2014-11-27 NOTE — Telephone Encounter (Signed)
No show, called pt to inform 3rd no show and will be discharged per policy.  Unable to leave message as answering machine said was invalid.    Aldona Lento, PTA

## 2014-12-02 ENCOUNTER — Encounter (HOSPITAL_COMMUNITY): Payer: BLUE CROSS/BLUE SHIELD | Admitting: Physical Therapy

## 2014-12-05 ENCOUNTER — Encounter (HOSPITAL_COMMUNITY): Payer: BLUE CROSS/BLUE SHIELD

## 2015-08-13 NOTE — Therapy (Signed)
[place], [place], [address] Phone: [phone]   Fax:  [phone]  Patient Details  Name: Justin Macdonald MRN: [id number] Date of Birth: 1970-06-10 Referring Provider:  Monico Blitz, MD  Encounter Date: 08/13/2015  PHYSICAL THERAPY DISCHARGE SUMMARY  Visits from Start of Care: 16  Current functional level related to goals / functional outcomes: Patient has not returned to skilled PT services since last skilled session    Remaining deficits: Unable to assess    Education / Equipment: None  Plan: Patient agrees to discharge.  Patient goals were partially met. Patient is being discharged due to not returning since the last visit.  ?????       Deniece Ree PT, DPT [place] [address] [place], [place], [address] Phone: [phone]   Fax:  [phone]

## 2015-10-16 ENCOUNTER — Ambulatory Visit (INDEPENDENT_AMBULATORY_CARE_PROVIDER_SITE_OTHER): Payer: BLUE CROSS/BLUE SHIELD | Admitting: Otolaryngology

## 2015-10-16 DIAGNOSIS — J310 Chronic rhinitis: Secondary | ICD-10-CM

## 2015-10-16 DIAGNOSIS — J343 Hypertrophy of nasal turbinates: Secondary | ICD-10-CM | POA: Diagnosis not present

## 2017-12-04 ENCOUNTER — Emergency Department (HOSPITAL_COMMUNITY): Payer: BLUE CROSS/BLUE SHIELD

## 2017-12-04 ENCOUNTER — Emergency Department (HOSPITAL_COMMUNITY)
Admission: EM | Admit: 2017-12-04 | Discharge: 2017-12-04 | Disposition: A | Discharge status: Home / Self Care | Payer: BLUE CROSS/BLUE SHIELD | Attending: Emergency Medicine | Admitting: Emergency Medicine

## 2017-12-04 ENCOUNTER — Other Ambulatory Visit: Payer: Self-pay

## 2017-12-04 ENCOUNTER — Encounter (HOSPITAL_COMMUNITY): Payer: Self-pay | Admitting: *Deleted

## 2017-12-04 DIAGNOSIS — Y999 Unspecified external cause status: Secondary | ICD-10-CM | POA: Diagnosis not present

## 2017-12-04 DIAGNOSIS — Z79899 Other long term (current) drug therapy: Secondary | ICD-10-CM | POA: Diagnosis not present

## 2017-12-04 DIAGNOSIS — W269XXA Contact with unspecified sharp object(s), initial encounter: Secondary | ICD-10-CM | POA: Insufficient documentation

## 2017-12-04 DIAGNOSIS — Y9289 Other specified places as the place of occurrence of the external cause: Secondary | ICD-10-CM | POA: Diagnosis not present

## 2017-12-04 DIAGNOSIS — S91111A Laceration without foreign body of right great toe without damage to nail, initial encounter: Secondary | ICD-10-CM

## 2017-12-04 DIAGNOSIS — Y9389 Activity, other specified: Secondary | ICD-10-CM | POA: Diagnosis not present

## 2017-12-04 MED ORDER — TETANUS-DIPHTH-ACELL PERTUSSIS 5-2.5-18.5 LF-MCG/0.5 IM SUSP
0.5000 mL | Freq: Once | INTRAMUSCULAR | Status: AC
  Administered 2017-12-04: 0.5 mL via INTRAMUSCULAR
  Filled 2017-12-04: qty 0.5

## 2017-12-04 MED ORDER — LIDOCAINE HCL (PF) 2 % IJ SOLN
INTRAMUSCULAR | Status: AC
  Filled 2017-12-04: qty 20

## 2017-12-04 MED ORDER — LIDOCAINE HCL (PF) 2 % IJ SOLN
10.0000 mL | Freq: Once | INTRAMUSCULAR | Status: AC
Start: 2017-12-04 — End: 2017-12-04
  Administered 2017-12-04: 10 mL via INTRADERMAL

## 2017-12-04 MED ORDER — CEPHALEXIN 500 MG PO CAPS: 500.0000 mg | ORAL_CAPSULE | Freq: Four times a day (QID) | ORAL | 0 refills | Status: AC

## 2017-12-04 NOTE — ED Triage Notes (Signed)
Pt with lac under right great toe. Unknown of tetanus shot. Bleeding controlled at present.  Pt had jumped from a rock and cut bottom of toe while at [facility] today,.

## 2017-12-04 NOTE — ED Provider Notes (Signed)
[facility] Provider Note   CSN: [id number] Arrival date & time: 12/04/17  1726     History   Chief Complaint Chief Complaint  Patient presents with  . Laceration    HPI Justin Macdonald is a 47 y.o. male with no significant past medical history presents today for evaluation of acute onset, persistent laceration to the plantar aspect of the right foot.  Patient states that approximately 1 hour prior to arrival he was jumping off of an embankment into a body of water when as he jumped off he sustained a laceration to the right foot.  He is unsure if he sustained this from a rock, a piece of glass, or another object.  He denies any pain or persistent bleeding.  No numbness, [place], or weakness.  He is not on any blood thinners.  He is unsure if his tetanus is up-to-date.  No aggravating or alleviating factors noted.  The history is provided by the patient.    History reviewed. No pertinent past medical history.  Patient Active Problem List   Diagnosis Date Noted  . S/P ACL reconstruction 03/02/2011  . Knee pain 03/02/2011  . Knee stiffness 03/02/2011    Past Surgical History:  Procedure Laterality Date  . KNEE SURGERY          Home Medications    Prior to Admission medications   Medication Sig Start Date End Date Taking? Authorizing Provider  AFLURIA PRESERVATIVE FREE 0.5 ML SUSY  03/21/14   [provider]  cephALEXin (KEFLEX) 500 MG capsule Take 1 capsule (500 mg total) by mouth 4 (four) times daily for 5 days. 12/04/17 12/09/17  Kenshin Splawn A, PA-C  CIALIS 20 MG tablet Take 20 mg by mouth daily. 04/08/14   [provider]  diclofenac (VOLTAREN) 75 MG EC tablet Take 75 mg by mouth 2 (two) times daily. 06/04/14   [provider]  escitalopram (LEXAPRO) 20 MG tablet Take 20 mg by mouth daily. 05/18/14   [provider]  levocetirizine (XYZAL) 5 MG tablet Take 5 mg by mouth daily. 05/15/14   [provider]      Family History History reviewed. No pertinent family history.  Social History Social History   Tobacco Use  . Smoking status: Former Research scientist (life sciences)  . Smokeless tobacco: Former Network engineer Use Topics  . Alcohol use: Yes    Alcohol/week: 0.0 oz  . Drug use: Never     Allergies   Patient has no known allergies.   Review of Systems Review of Systems  Constitutional: Negative for chills and fever.  Skin: Positive for wound.  Neurological: Negative for syncope, weakness and numbness.     Physical Exam Updated Vital Signs BP (!) 153/101 (BP Location: Left Arm)   Pulse 88   Temp 98.2 F (36.8 C) (Oral)   Resp 16   Ht 5\' 7"  (1.702 m)   Wt 77.1 kg (170 lb)   SpO2 100%   BMI 26.63 kg/m   Physical Exam  Constitutional: He appears well-developed and well-nourished. No distress.  HENT:  Head: Normocephalic and atraumatic.  Eyes: Conjunctivae are normal. Right eye exhibits no discharge. Left eye exhibits no discharge.  Neck: No JVD present. No tracheal deviation present.  Cardiovascular: Normal rate and intact distal pulses.  2+ DP/PT pulses bilaterally, no lower extremity edema  Pulmonary/Chest: Effort normal.  Abdominal: He exhibits no distension.  Musculoskeletal: He exhibits no edema.  3 cm laceration to the plantar aspect of  the proximal right great toe at the junction of the toe and the ball of the foot.  Bleeding is controlled.  5/5 strength of bilateral feet with plantarflexion and dorsiflexion as well as 5/5 strength of EHLs bilaterally.  No crepitus or deformity noted.  No foreign bodies noted.  Some ecchymosis noted to the plantar aspect of the right foot.  Neurological: He is alert.  Fluent speech, no facial droop, sensation intact to soft touch of bilateral feet.  Skin: Skin is warm and dry. No erythema.  Psychiatric: He has a normal mood and affect. His behavior is normal.  Nursing note and vitals reviewed.    ED Treatments / Results  Labs (all labs  ordered are listed, but only abnormal results are displayed) Labs Reviewed - No data to display  EKG None  Radiology No results found.  Procedures .Marland KitchenLaceration Repair Date/Time: 12/04/2017 6:41 PM Performed by: Renita Papa, PA-C Authorized by: Renita Papa, PA-C   Consent:    Consent obtained:  Verbal   Consent given by:  Patient   Risks discussed:  Infection, pain, poor cosmetic result, retained foreign body and poor wound healing Laceration details:    Location:  Toe   Toe location:  R big toe   Length (cm):  3   Depth (mm):  4 Repair type:    Repair type:  Simple Pre-procedure details:    Preparation:  Patient was prepped and draped in usual sterile fashion Exploration:    Hemostasis achieved with:  Direct pressure   Wound exploration: wound explored through full range of motion and entire depth of wound probed and visualized     Wound extent: no foreign bodies/material noted, no nerve damage noted and no tendon damage noted     Contaminated: yes   Treatment:    Area cleansed with:  Betadine and saline   Amount of cleaning:  Extensive   Irrigation solution:  Sterile saline   Irrigation volume:  2L   Irrigation method:  Pressure wash   Visualized foreign bodies/material removed: no   Skin repair:    Repair method:  Sutures   Suture size:  4-0   Suture material:  Prolene   Suture technique:  Simple interrupted   Number of sutures:  5 Approximation:    Approximation:  Close Post-procedure details:    Dressing:  Non-adherent dressing and splint for protection   Patient tolerance of procedure:  Tolerated well, no immediate complications   (including critical care time)  Medications Ordered in ED Medications  lidocaine (XYLOCAINE) 2 % injection 10 mL (10 mLs Intradermal Given by Other 12/04/17 1851)  Tdap (BOOSTRIX) injection 0.5 mL (0.5 mLs Intramuscular Given 12/04/17 1845)     Initial Impression / Assessment and Plan / ED Course  I have reviewed the  triage vital signs and the nursing notes.  Pertinent labs & imaging results that were available during my care of the patient were reviewed by me and considered in my medical decision making (see chart for details).     Patient with acute laceration of the right great toe just prior to arrival.  He is afebrile, initially tachycardic with complete resolution on reevaluation. He is neurovascularly intact.  He declined x-rays for evaluation of underlying fracture or possible radiopaque foreign body.  I had a long discussion with the patient and he understands the possibility of missed fracture or foreign body diagnosis.Pressure irrigation performed. Wound explored and base of wound visualized in a bloodless field without evidence  of foreign body.  Laceration occurred < 8 hours prior to repair which was well tolerated. Tdap updated.  Pt has no comorbidities to effect normal wound healing.  Patient understands that this particular wound is at high risk of infection.  Will discharge with Keflex prophylactically. Discussed suture home care with patient and answered questions. Pt to follow-up for wound check and suture removal in 7 days; they are to return to the ED sooner for signs of infection. Pt is hemodynamically stable with no complaints prior to dc.  He and his visitor Loree Fee verbalized understanding of and agreement with plan and patient stable for discharge home at this time.   Final Clinical Impressions(s) / ED Diagnoses   Final diagnoses:  Laceration of right great toe without foreign body present or damage to nail, initial encounter    ED Discharge Orders        Ordered    cephALEXin (KEFLEX) 500 MG capsule  4 times daily     12/04/17 1848       Renita Papa, PA-C 12/04/17 Randol Kern    Virgel Manifold, MD 12/04/17 2358

## 2017-12-04 NOTE — Discharge Instructions (Signed)
Please take all of your antibiotics until finished!   You may develop abdominal discomfort or diarrhea from the antibiotic.  You may help offset this with probiotics which you can buy or get in yogurt. Do not eat  or take the probiotics until 2 hours after your antibiotic.   Keep wound and clean with mild soap and water.  Do not submerge in water for the next 24 hours.  Ice for additional pain relief. Alternate 600 mg of ibuprofen and [phone] mg of Tylenol every 3 hours as needed for pain. Do not exceed 4000 mg of Tylenol daily.  Take ibuprofen with food to avoid upset stomach issues.  Follow up with the [facility] Emergency department, [facility], or your primary care physician in approximately 7 days for wound recheck and suture removal. Monitor for signs of infection to include but not limited to increasing pain, redness, drainage, or swelling. Return to emergency department for emergent changing or worsening symptoms.

## 2017-12-04 NOTE — ED Notes (Signed)
Toes buddy taped and non-adherent dressing applied to wound and covered with Kerlex.

## 2018-05-23 DIAGNOSIS — M25522 Pain in left elbow: Secondary | ICD-10-CM | POA: Diagnosis not present

## 2018-07-25 DIAGNOSIS — M170 Bilateral primary osteoarthritis of knee: Secondary | ICD-10-CM | POA: Diagnosis not present

## 2018-08-01 DIAGNOSIS — M170 Bilateral primary osteoarthritis of knee: Secondary | ICD-10-CM | POA: Diagnosis not present

## 2018-08-08 DIAGNOSIS — M170 Bilateral primary osteoarthritis of knee: Secondary | ICD-10-CM | POA: Diagnosis not present

## 2018-08-08 DIAGNOSIS — E349 Endocrine disorder, unspecified: Secondary | ICD-10-CM | POA: Diagnosis not present

## 2020-05-04 ENCOUNTER — Other Ambulatory Visit: Payer: Self-pay

## 2020-05-04 ENCOUNTER — Emergency Department (HOSPITAL_COMMUNITY): Payer: Worker's Compensation

## 2020-05-04 ENCOUNTER — Encounter (HOSPITAL_COMMUNITY): Payer: Self-pay | Admitting: *Deleted

## 2020-05-04 ENCOUNTER — Emergency Department (HOSPITAL_COMMUNITY)
Admission: EM | Admit: 2020-05-04 | Discharge: 2020-05-04 | Disposition: A | Discharge status: Home / Self Care | Payer: Worker's Compensation | Attending: Emergency Medicine | Admitting: Emergency Medicine

## 2020-05-04 DIAGNOSIS — R030 Elevated blood-pressure reading, without diagnosis of hypertension: Secondary | ICD-10-CM

## 2020-05-04 DIAGNOSIS — S43401A Unspecified sprain of right shoulder joint, initial encounter: Secondary | ICD-10-CM | POA: Diagnosis not present

## 2020-05-04 DIAGNOSIS — W208XXA Other cause of strike by thrown, projected or falling object, initial encounter: Secondary | ICD-10-CM | POA: Insufficient documentation

## 2020-05-04 DIAGNOSIS — Y990 Civilian activity done for income or pay: Secondary | ICD-10-CM | POA: Diagnosis not present

## 2020-05-04 DIAGNOSIS — S4991XA Unspecified injury of right shoulder and upper arm, initial encounter: Secondary | ICD-10-CM | POA: Diagnosis present

## 2020-05-04 DIAGNOSIS — S63511A Sprain of carpal joint of right wrist, initial encounter: Secondary | ICD-10-CM | POA: Diagnosis not present

## 2020-05-04 DIAGNOSIS — Z87891 Personal history of nicotine dependence: Secondary | ICD-10-CM | POA: Diagnosis not present

## 2020-05-04 NOTE — ED Triage Notes (Signed)
The pt had a bridge collapse on him 2 hours ago he has pain in his rt shoulder and his rt wrist  Good radial pulse

## 2020-05-04 NOTE — ED Provider Notes (Signed)
[facility] EMERGENCY DEPARTMENT Provider Note   CSN: [id number] Arrival date & time: 05/04/20  0151   History Chief Complaint  Patient presents with  . Shoulder Pain    Justin Macdonald is a 49 y.o. male.  The history is provided by the patient.  Shoulder Pain He states that he fell through the floor boards of a bridge but caught himself before actually falling through the bridge.  He is complaining of pain in his right wrist and right shoulder.  Pain is rated at 8/10.  He denies other injury.  History reviewed. No pertinent past medical history.  Patient Active Problem List   Diagnosis Date Noted  . S/P ACL reconstruction 03/02/2011  . Knee pain 03/02/2011  . Knee stiffness 03/02/2011    Past Surgical History:  Procedure Laterality Date  . KNEE SURGERY         No family history on file.  Social History   Tobacco Use  . Smoking status: Former Research scientist (life sciences)  . Smokeless tobacco: Former Network engineer  . Vaping Use: Never used  Substance Use Topics  . Alcohol use: Yes    Alcohol/week: 0.0 standard drinks  . Drug use: Never    Home Medications Prior to Admission medications   Medication Sig Start Date End Date Taking? Authorizing Provider  AFLURIA PRESERVATIVE FREE 0.5 ML SUSY  03/21/14   [provider]  CIALIS 20 MG tablet Take 20 mg by mouth daily. 04/08/14   [provider]  diclofenac (VOLTAREN) 75 MG EC tablet Take 75 mg by mouth 2 (two) times daily. 06/04/14   [provider]  escitalopram (LEXAPRO) 20 MG tablet Take 20 mg by mouth daily. 05/18/14   [provider]  levocetirizine (XYZAL) 5 MG tablet Take 5 mg by mouth daily. 05/15/14   [provider]    Allergies    Patient has no known allergies.  Review of Systems   Review of Systems  All other systems reviewed and are negative.   Physical Exam Updated Vital Signs BP (!) 159/85   Pulse (!) 104   Temp 98.3 F (36.8 C)   Resp 18    Ht 5\' 7"  (1.702 m)   Wt 77.1 kg   SpO2 98%   BMI 26.62 kg/m   Physical Exam Vitals and nursing note reviewed.   49 year old male, resting comfortably and in no acute distress. Vital signs are significant for elevated blood pressure. Oxygen saturation is 98%, which is normal. Head is normocephalic and atraumatic. PERRLA, EOMI. Oropharynx is clear. Neck is nontender and supple without adenopathy or JVD. Back is nontender and there is no CVA tenderness. Lungs are clear without rales, wheezes, or rhonchi. Chest is nontender. Heart has regular rate and rhythm without murmur. Abdomen is soft, flat, nontender without masses or hepatosplenomegaly and peristalsis is normoactive. Extremities: No swelling or deformity appreciated.  There is full passive range of motion of all joints including the right shoulder and right wrist, but there is pain on passive range of motion of the right shoulder and right wrist.  There is mild tenderness to palpation rather diffusely throughout the right shoulder and right wrist without any point tenderness. Skin is warm and dry without rash. Neurologic: Mental status is normal, cranial nerves are intact, there are no motor or sensory deficits.  ED Results / Procedures / Treatments    Radiology DG Shoulder Right  Result Date: 05/04/2020 CLINICAL DATA:  Pain after a fall  EXAM: RIGHT SHOULDER - 2+ VIEW COMPARISON:  None. FINDINGS: There is no evidence of fracture or dislocation. There is no evidence of arthropathy or other focal bone abnormality. Soft tissues are unremarkable. IMPRESSION: Negative. Electronically Signed   By: Lucienne Capers M.D.   On: 05/04/2020 03:30   DG Wrist Complete Right  Result Date: 05/04/2020 CLINICAL DATA:  Pain after a fall EXAM: RIGHT WRIST - COMPLETE 3+ VIEW COMPARISON:  None. FINDINGS: There is no evidence of fracture or dislocation. There is no evidence of arthropathy or other focal bone abnormality. Soft tissues are  unremarkable. IMPRESSION: Negative. Electronically Signed   By: Lucienne Capers M.D.   On: 05/04/2020 03:30    Procedures Procedures   Medications Ordered in ED Medications - No data to display  ED Course  I have reviewed the triage vital signs and the nursing notes.  Pertinent imaging results that were available during my care of the patient were reviewed by me and considered in my medical decision making (see chart for details).  MDM Rules/Calculators/A&P Injury to right wrist and right shoulder from falling through bridge flooring.  X-rays are negative for acute bony injury.  Patient is advised to apply ice and use over-the-counter analgesics as needed for pain.  Given a wrist brace for comfort.  Advised blood pressure rechecked as an outpatient.  Final Clinical Impression(s) / ED Diagnoses Final diagnoses:  Sprain of right shoulder, initial encounter  Sprain of carpal joint of right wrist, initial encounter  Elevated blood pressure reading without diagnosis of hypertension    Rx / DC Orders ED Discharge Orders    None       Delora Fuel, MD 55/97/41 [phone]

## 2020-05-04 NOTE — ED Notes (Signed)
E-signature pad unavailable at time of pt discharge. This RN discussed discharge materials with pt and answered all pt questions. Pt stated understanding of discharge material.

## 2020-05-04 NOTE — Discharge Instructions (Addendum)
Apply ice to any painful area.  Ice should be applied for 30 minutes at a time, up to 4 times a day.  Take ibuprofen or naproxen as needed for pain.  If you are not getting adequate pain relief, you may supplement with acetaminophen.  Wear the wrist brace as needed.  It is only for comfort.  Put your shoulder through a full range of motion 2-3 times a day.  This is to prevent a frozen shoulder from developing.  Your blood pressure was elevated today.  Please have it rechecked several times over the next 2 weeks.  If your blood pressure stays high, you may need to be on medication to bring it down.  Inadequately controlled high blood pressure leads to heart attacks, strokes, kidney failure.

## 2020-09-19 ENCOUNTER — Other Ambulatory Visit: Payer: Self-pay | Admitting: Orthopedic Surgery

## 2020-09-19 DIAGNOSIS — M25531 Pain in right wrist: Secondary | ICD-10-CM

## 2020-10-14 ENCOUNTER — Ambulatory Visit
Admission: RE | Admit: 2020-10-14 | Discharge: 2020-10-14 | Disposition: A | Discharge status: Home / Self Care | Payer: Worker's Compensation | Source: Ambulatory Visit | Attending: Orthopedic Surgery | Admitting: Orthopedic Surgery

## 2020-10-14 ENCOUNTER — Other Ambulatory Visit: Payer: Self-pay

## 2020-10-14 DIAGNOSIS — M25531 Pain in right wrist: Secondary | ICD-10-CM

## 2020-10-14 MED ORDER — IOPAMIDOL (ISOVUE-M 200) INJECTION 41%
2.0000 mL | Freq: Once | INTRAMUSCULAR | Status: AC
  Administered 2020-10-14: 2 mL via INTRA_ARTICULAR

## 2021-02-17 ENCOUNTER — Encounter: Payer: Self-pay | Admitting: Internal Medicine

## 2021-07-03 ENCOUNTER — Ambulatory Visit: Payer: Self-pay | Admitting: Gastroenterology

## 2021-07-03 ENCOUNTER — Ambulatory Visit (INDEPENDENT_AMBULATORY_CARE_PROVIDER_SITE_OTHER): Payer: Self-pay | Admitting: *Deleted

## 2021-07-03 ENCOUNTER — Other Ambulatory Visit: Payer: Self-pay

## 2021-07-03 VITALS — Ht 67.0 in | Wt 185.0 lb

## 2021-07-03 DIAGNOSIS — Z1211 Encounter for screening for malignant neoplasm of colon: Secondary | ICD-10-CM

## 2021-07-03 NOTE — Progress Notes (Addendum)
Gastroenterology Pre-Procedure Review  Request Date: 07/03/2021 Requesting Physician: Dr. Manuella Ghazi, no previous TCS  PATIENT REVIEW QUESTIONS: The patient responded to the following health history questions as indicated:    1. Diabetes Melitis: no 2. Joint replacements in the past 12 months: no 3. Major health problems in the past 3 months: no 4. Has an artificial valve or MVP: no 5. Has a defibrillator: no 6. Has been advised in past to take antibiotics in advance of a procedure like teeth cleaning: no 7. Family history of colon cancer: no  8. Alcohol Use: no 9. Illicit drug Use: no 10. History of sleep apnea: no  11. History of coronary artery or other vascular stents placed within the last 12 months: no 12. History of any prior anesthesia complications: no 13. Body mass index is 28.98 kg/m.    MEDICATIONS & ALLERGIES:    Patient reports the following regarding taking any blood thinners:   Plavix? no Aspirin? no Coumadin? no Brilinta? no Xarelto? no Eliquis? no Pradaxa? no Savaysa? no Effient? no  Patient confirms/reports the following medications:  Current Outpatient Medications  Medication Sig Dispense Refill   escitalopram (LEXAPRO) 20 MG tablet Take 20 mg by mouth daily.  12   levocetirizine (XYZAL) 5 MG tablet Take 5 mg by mouth daily.  12   testosterone cypionate (DEPOTESTOSTERONE CYPIONATE) 200 MG/ML injection Inject 200 mg into the muscle once a week.     No current facility-administered medications for this visit.    Patient confirms/reports the following allergies:  No Known Allergies  No orders of the defined types were placed in this encounter.   AUTHORIZATION INFORMATION Primary Insurance: [employer],  [place] #: [id number],  Group #: [id number] Pre-Cert / Auth required: No, not required per Cherlynn Kaiser Pre-Cert / Auth #: Ref: Cherlynn Kaiser 07/09/2021  SCHEDULE INFORMATION: Procedure has been scheduled as follows:  Date: 07/17/2021, Time: 12:30 Location: [facility]  with Dr. Abbey Chatters  This Gastroenterology Pre-Precedure Review Form is being routed to the following provider(s): Neil Crouch, PA-C

## 2021-07-06 NOTE — Progress Notes (Signed)
Ok to schedule. ASA 1 

## 2021-07-07 ENCOUNTER — Encounter: Payer: Self-pay | Admitting: *Deleted

## 2021-07-07 MED ORDER — NA SULFATE-K SULFATE-MG SULF 17.5-3.13-1.6 GM/177ML PO SOLN: 1.0000 | Freq: Once | ORAL | 0 refills | Status: AC

## 2021-07-07 NOTE — Addendum Note (Signed)
Addended by: Metro Kung on: 07/07/2021 10:52 AM   Modules accepted: Orders

## 2021-07-07 NOTE — Progress Notes (Signed)
Spoke to pt.  Scheduled procedure for 07/17/2021 with arrival at 11:00 at [facility].  Pt declined reviewing prep instructions and procedure information by telephone.  He said that he is very busy today at work.  He is aware that I am mailing them out to him.  He agreed to call me if any questions.  Pt made aware to pick up prep kit at pharmacy.

## 2021-07-09 ENCOUNTER — Other Ambulatory Visit: Payer: Self-pay | Admitting: *Deleted

## 2021-07-13 ENCOUNTER — Telehealth: Payer: Self-pay | Admitting: Internal Medicine

## 2021-07-13 NOTE — Telephone Encounter (Signed)
PATIENT WANTS TO CANCEL HIS PROCEDURE

## 2021-07-13 NOTE — Telephone Encounter (Signed)
Lmom for pt to call me back.  Want to see if he would like to reschedule.

## 2021-07-14 NOTE — Telephone Encounter (Signed)
Spoke to pt.  He requested to be rescheduled to Mar.  Informed him that I would contact him once procedure schedules are available.  Lmom of Carolyn yesterday to cancel procedure.

## 2021-07-15 ENCOUNTER — Encounter: Payer: Self-pay | Admitting: *Deleted

## 2021-07-15 NOTE — Telephone Encounter (Signed)
Spoke to pt.  Rescheduled procedure to 08/07/2021 with arrival at 6:30 at [facility].  Pt made aware that I will mail him out new prep instructions.

## 2021-07-17 ENCOUNTER — Ambulatory Visit (HOSPITAL_COMMUNITY): Admission: RE | Admit: 2021-07-17 | Payer: Self-pay | Source: Home / Self Care

## 2021-07-17 ENCOUNTER — Encounter (HOSPITAL_COMMUNITY): Admission: RE | Payer: Self-pay | Source: Home / Self Care

## 2021-07-17 SURGERY — COLONOSCOPY WITH PROPOFOL
Anesthesia: Monitor Anesthesia Care

## 2021-08-07 ENCOUNTER — Ambulatory Visit (HOSPITAL_COMMUNITY): Payer: 59 | Admitting: Anesthesiology

## 2021-08-07 ENCOUNTER — Other Ambulatory Visit: Payer: Self-pay

## 2021-08-07 ENCOUNTER — Encounter (HOSPITAL_COMMUNITY): Payer: Self-pay

## 2021-08-07 ENCOUNTER — Ambulatory Visit (HOSPITAL_BASED_OUTPATIENT_CLINIC_OR_DEPARTMENT_OTHER): Payer: 59 | Admitting: Anesthesiology

## 2021-08-07 ENCOUNTER — Ambulatory Visit (HOSPITAL_COMMUNITY)
Admission: RE | Admit: 2021-08-07 | Discharge: 2021-08-07 | Disposition: A | Discharge status: Home / Self Care | Payer: 59 | Attending: Internal Medicine | Admitting: Internal Medicine

## 2021-08-07 ENCOUNTER — Encounter (HOSPITAL_COMMUNITY)
Admission: RE | Disposition: A | Discharge status: Home / Self Care | Payer: Self-pay | Source: Home / Self Care | Attending: Internal Medicine

## 2021-08-07 DIAGNOSIS — K635 Polyp of colon: Secondary | ICD-10-CM

## 2021-08-07 DIAGNOSIS — Z1211 Encounter for screening for malignant neoplasm of colon: Secondary | ICD-10-CM | POA: Diagnosis not present

## 2021-08-07 DIAGNOSIS — D122 Benign neoplasm of ascending colon: Secondary | ICD-10-CM | POA: Diagnosis not present

## 2021-08-07 DIAGNOSIS — Z87891 Personal history of nicotine dependence: Secondary | ICD-10-CM | POA: Insufficient documentation

## 2021-08-07 DIAGNOSIS — K5730 Diverticulosis of large intestine without perforation or abscess without bleeding: Secondary | ICD-10-CM | POA: Diagnosis not present

## 2021-08-07 DIAGNOSIS — F32A Depression, unspecified: Secondary | ICD-10-CM | POA: Diagnosis not present

## 2021-08-07 DIAGNOSIS — K648 Other hemorrhoids: Secondary | ICD-10-CM | POA: Insufficient documentation

## 2021-08-07 HISTORY — PX: POLYPECTOMY: SHX149

## 2021-08-07 HISTORY — DX: Depression, unspecified: F32.A

## 2021-08-07 HISTORY — PX: COLONOSCOPY WITH PROPOFOL: SHX5780

## 2021-08-07 SURGERY — COLONOSCOPY WITH PROPOFOL
Anesthesia: General

## 2021-08-07 MED ORDER — LACTATED RINGERS IV SOLN: INTRAVENOUS | Status: DC | PRN

## 2021-08-07 MED ORDER — PROPOFOL 500 MG/50ML IV EMUL
INTRAVENOUS | Status: DC | PRN
  Administered 2021-08-07: 150 ug/kg/min via INTRAVENOUS

## 2021-08-07 MED ORDER — LIDOCAINE HCL (CARDIAC) PF 100 MG/5ML IV SOSY
PREFILLED_SYRINGE | INTRAVENOUS | Status: DC | PRN
  Administered 2021-08-07: 50 mg via INTRAVENOUS

## 2021-08-07 MED ORDER — PROPOFOL 10 MG/ML IV BOLUS
INTRAVENOUS | Status: DC | PRN
  Administered 2021-08-07: 120 mg via INTRAVENOUS

## 2021-08-07 NOTE — Op Note (Signed)
[facility] ?Patient Name: Justin Macdonald ?Procedure Date: 08/07/2021 7:48 AM ?MRN: [id number] ?Date of Birth: 1970/07/30 ?Attending MD: Elon Alas. Abbey Chatters , DO ?CSN: [id number] ?Age: 51 ?Admit Type: Outpatient ?Procedure:                Colonoscopy ?Indications:              Screening for colorectal malignant neoplasm ?Providers:                Elon Alas. Abbey Chatters, DO, Ralston Page, [place]                          Risa Grill, Technician ?Referring MD:              ?Medicines:                See the Anesthesia note for documentation of the  ?                          administered medications ?Complications:            No immediate complications. ?Estimated Blood Loss:     Estimated blood loss was minimal. ?Procedure:                Pre-Anesthesia Assessment: ?                          - The anesthesia plan was to use monitored  ?                          anesthesia care (MAC). ?                          After obtaining informed consent, the colonoscope  ?                          was passed under direct vision. Throughout the  ?                          procedure, the patient's blood pressure, pulse, and  ?                          oxygen saturations were monitored continuously. The  ?                          PCF-HQ190L ([id number]) scope was introduced through  ?                          the anus and advanced to the the cecum, identified  ?                          by appendiceal orifice and ileocecal valve. The  ?                          colonoscopy was performed without difficulty. The  ?                          patient tolerated the procedure well.  The quality  ?                          of the bowel preparation was evaluated using the  ?                          BBPS [facility] Bowel Preparation Scale) with scores  ?                          of: Right Colon = 3, Transverse Colon = 3 and Left  ?                          Colon = 3 (entire mucosa seen well with no residual  ?                           staining, small fragments of stool or opaque  ?                          liquid). The total BBPS score equals 9. ?Scope In: 7:58:02 AM ?Scope Out: 8:10:13 AM ?Scope Withdrawal Time: 0 hours 10 minutes 16 seconds  ?Total Procedure Duration: 0 hours 12 minutes 11 seconds  ?Findings: ?     The perianal and digital rectal examinations were normal. ?     Non-bleeding internal hemorrhoids were found during endoscopy. ?     A few small-mouthed diverticula were found in the entire colon. ?     A 6 mm polyp was found in the ascending colon. The polyp was sessile.  ?     The polyp was removed with a cold snare. Resection and retrieval were  ?     complete. ?     The exam was otherwise without abnormality. ?Impression:               - Non-bleeding internal hemorrhoids. ?                          - Diverticulosis in the entire examined colon. ?                          - One 6 mm polyp in the ascending colon, removed  ?                          with a cold snare. Resected and retrieved. ?                          - The examination was otherwise normal. ?Moderate Sedation: ?     Per Anesthesia Care ?Recommendation:           - Patient has a contact number available for  ?                          emergencies. The signs and symptoms of potential  ?                          delayed complications were discussed with the  ?  patient. Return to normal activities tomorrow.  ?                          Written discharge instructions were provided to the  ?                          patient. ?                          - Resume previous diet. ?                          - Continue present medications. ?                          - Await pathology results. ?                          - Repeat colonoscopy in 5 years for surveillance. ?                          - Return to GI clinic PRN. ?Procedure Code(s):        --- Professional --- ?                          [phone], Colonoscopy, flexible; with removal of  ?                           tumor(s), polyp(s), or other lesion(s) by snare  ?                          technique ?Diagnosis Code(s):        --- Professional --- ?                          Z12.11, Encounter for screening for malignant  ?                          neoplasm of colon ?                          K64.8, Other hemorrhoids ?                          K63.5, Polyp of colon ?                          K57.30, Diverticulosis of large intestine without  ?                          perforation or abscess without bleeding ?CPT copyright 2019 American Medical Association. All rights reserved. ?The codes documented in this report are preliminary and upon coder review may  ?be revised to meet current compliance requirements. ?Elon Alas. Abbey Chatters, DO ?Elon Alas. Abbey Chatters, DO ?08/07/2021 8:15:47 AM ?This report has been signed electronically. ?Number of Addenda: 0 ?

## 2021-08-07 NOTE — Discharge Instructions (Addendum)
?  Colonoscopy ?Discharge Instructions ? ?Read the instructions outlined below and refer to this sheet in the next few weeks. These discharge instructions provide you with general information on caring for yourself after you leave the hospital. Your doctor may also give you specific instructions. While your treatment has been planned according to the most current medical practices available, unavoidable complications occasionally occur.  ? ?ACTIVITY ?You may resume your regular activity, but move at a slower pace for the next 24 hours.  ?Take frequent rest periods for the next 24 hours.  ?Walking will help get rid of the air and reduce the bloated feeling in your belly (abdomen).  ?No driving for 24 hours (because of the medicine (anesthesia) used during the test).   ?Do not sign any important legal documents or operate any machinery for 24 hours (because of the anesthesia used during the test).  ?NUTRITION ?Drink plenty of fluids.  ?You may resume your normal diet as instructed by your doctor.  ?Begin with a light meal and progress to your normal diet. Heavy or fried foods are harder to digest and may make you feel sick to your stomach (nauseated).  ?Avoid alcoholic beverages for 24 hours or as instructed.  ?MEDICATIONS ?You may resume your normal medications unless your doctor tells you otherwise.  ?WHAT YOU CAN EXPECT TODAY ?Some feelings of bloating in the abdomen.  ?Passage of more gas than usual.  ?Spotting of blood in your stool or on the toilet paper.  ?IF YOU HAD POLYPS REMOVED DURING THE COLONOSCOPY: ?No aspirin products for 7 days or as instructed.  ?No alcohol for 7 days or as instructed.  ?Eat a soft diet for the next 24 hours.  ?FINDING OUT THE RESULTS OF YOUR TEST ?Not all test results are available during your visit. If your test results are not back during the visit, make an appointment with your caregiver to find out the results. Do not assume everything is normal if you have not heard from your  caregiver or the medical facility. It is important for you to follow up on all of your test results.  ?SEEK IMMEDIATE MEDICAL ATTENTION IF: ?You have more than a spotting of blood in your stool.  ?Your belly is swollen (abdominal distention).  ?You are nauseated or vomiting.  ?You have a temperature over 101.  ?You have abdominal pain or discomfort that is severe or gets worse throughout the day.  ? ?Your colonoscopy revealed 1 polyp(s) which I removed successfully. Await pathology results, my office will contact you. I recommend repeating colonoscopy in 5 years for surveillance purposes.  ? ?You also have diverticulosis and internal hemorrhoids. I would recommend increasing fiber in your diet. Be sure to drink at least 4 to 6 glasses of water daily. Follow-up with GI as needed. ? ? ?I hope you have a great rest of your week! ? ?Elon Alas. Abbey Chatters, D.O. ?Gastroenterology and Hepatology ?[facility] ? ?

## 2021-08-07 NOTE — Anesthesia Postprocedure Evaluation (Signed)
Anesthesia Post Note ? ?Patient: Justin Macdonald ? ?Procedure(s) Performed: COLONOSCOPY WITH PROPOFOL ?POLYPECTOMY INTESTINAL ? ?Patient location during evaluation: Endoscopy ?Anesthesia Type: General ?Level of consciousness: awake and alert and oriented ?Pain management: pain level controlled ?Vital Signs Assessment: post-procedure vital signs reviewed and stable ?Respiratory status: spontaneous breathing, nonlabored ventilation and respiratory function stable ?Cardiovascular status: blood pressure returned to baseline and stable ?Postop Assessment: no apparent nausea or vomiting ?Anesthetic complications: no ? ? ?No notable events documented. ? ? ?Last Vitals:  ?Vitals:  ? 08/07/21 0704 08/07/21 0813  ?BP: (!) 145/100 112/81  ?Pulse: 70 84  ?Resp: 19 19  ?Temp: 36.7 ?C (!) 36.4 ?C  ?SpO2: 97% 94%  ?  ?Last Pain:  ?Vitals:  ? 08/07/21 0813  ?TempSrc: Oral  ?PainSc: 0-No pain  ? ? ?  ?  ?  ?  ?  ?  ? ?Saniyya Gau C Debbie Yearick ? ? ? ? ?

## 2021-08-07 NOTE — H&P (Signed)
?Primary Care Physician:  Monico Blitz, MD ?Primary Gastroenterologist:  Dr. Abbey Chatters ? ?Pre-Procedure History & Physical: ?HPI:  Justin Macdonald is a 51 y.o. male is here for first ever colonoscopy for colon cancer screening purposes.  Patient denies any family history of colorectal cancer.  No melena or hematochezia.  No abdominal pain or unintentional weight loss.  No change in bowel habits.  Overall feels well from a GI standpoint. ? ?Past Medical History:  ?Diagnosis Date  ? Depression   ? ? ?Past Surgical History:  ?Procedure Laterality Date  ? KNEE SURGERY    ? right shoulder surgery    ? right wrist surgery    ? ? ?Prior to Admission medications   ?Medication Sig Start Date End Date Taking? Authorizing Provider  ?escitalopram (LEXAPRO) 20 MG tablet Take 20 mg by mouth in the morning. 05/18/14  Yes [provider]  ?levocetirizine (XYZAL) 5 MG tablet Take 5 mg by mouth daily. 05/15/14  Yes [provider]  ?testosterone cypionate (DEPOTESTOSTERONE CYPIONATE) 200 MG/ML injection Inject 200 mg into the muscle once a week. Sunday OR Monday 06/22/21  Yes [provider]  ? ? ?Allergies as of 07/15/2021  ? (No Known Allergies)  ? ? ?Family History  ?Problem Relation Age of Onset  ? Colon cancer Neg Hx   ? ? ?Social History  ? ?Socioeconomic History  ? Marital status: Married  ?  Spouse name: Not on file  ? Number of children: Not on file  ? Years of education: Not on file  ? Highest education level: Not on file  ?Occupational History  ? Not on file  ?Tobacco Use  ? Smoking status: Former  ? Smokeless tobacco: Former  ?Vaping Use  ? Vaping Use: Never used  ?Substance and Sexual Activity  ? Alcohol use: Not Currently  ? Drug use: Never  ? Sexual activity: Not on file  ?Other Topics Concern  ? Not on file  ?Social History Narrative  ? Not on file  ? ?Social Determinants of Health  ? ?Financial Resource Strain: Not on file  ?Food Insecurity: Not on file  ?Transportation Needs: Not on file   ?Physical Activity: Not on file  ?Stress: Not on file  ?Social Connections: Not on file  ?Intimate Partner Violence: Not on file  ? ? ?Review of Systems: ?See HPI, otherwise negative ROS ? ?Physical Exam: ?Vital signs in last 24 hours: ?Temp:  [98.1 ?F (36.7 ?C)] 98.1 ?F (36.7 ?C) (03/03 2536) ?Pulse Rate:  [70] 70 (03/03 0704) ?Resp:  [19] 19 (03/03 0704) ?BP: (145)/(100) 145/100 (03/03 0704) ?SpO2:  [97 %] 97 % (03/03 0704) ?Weight:  [81.6 kg] 81.6 kg (03/03 0704) ?  ?General:   Alert,  Well-developed, well-nourished, pleasant and cooperative in NAD ?Head:  Normocephalic and atraumatic. ?Eyes:  Sclera clear, no icterus.   Conjunctiva pink. ?Ears:  Normal auditory acuity. ?Nose:  No deformity, discharge,  or lesions. ?Mouth:  No deformity or lesions, dentition normal. ?Neck:  Supple; no masses or thyromegaly. ?Lungs:  Clear throughout to auscultation.   No wheezes, crackles, or rhonchi. No acute distress. ?Heart:  Regular rate and rhythm; no murmurs, clicks, rubs,  or gallops. ?Abdomen:  Soft, nontender and nondistended. No masses, hepatosplenomegaly or hernias noted. Normal bowel sounds, without guarding, and without rebound.   ?Msk:  Symmetrical without gross deformities. Normal posture. ?Extremities:  Without clubbing or edema. ?Neurologic:  Alert and  oriented x4;  grossly normal neurologically. ?Skin:  Intact without significant lesions  or rashes. ?Cervical Nodes:  No significant cervical adenopathy. ?Psych:  Alert and cooperative. Normal mood and affect. ? ?Impression/Plan: ?Justin Macdonald is here for a colonoscopy to be performed for colon cancer screening purposes. ? ?The risks of the procedure including infection, bleed, or perforation as well as benefits, limitations, alternatives and imponderables have been reviewed with the patient. Questions have been answered. All parties agreeable. ? ?

## 2021-08-07 NOTE — Anesthesia Preprocedure Evaluation (Addendum)
Anesthesia Evaluation  ?Patient identified by MRN, date of birth, ID band ?Patient awake ? ? ? ?Reviewed: ?Allergy & Precautions, NPO status , Patient's Chart, lab work & pertinent test results ? ?Airway ?Mallampati: II ? ?TM Distance: >3 FB ?Neck ROM: Full ? ? ? Dental ? ?(+) Dental Advisory Given, Caps ?  ?Pulmonary ?former smoker,  ?  ?Pulmonary exam normal ?breath sounds clear to auscultation ? ? ? ? ? ? Cardiovascular ?negative cardio ROS ?Normal cardiovascular exam ?Rhythm:Regular Rate:Normal ? ? ?  ?Neuro/Psych ?PSYCHIATRIC DISORDERS Depression negative neurological ROS ?   ? GI/Hepatic ?negative GI ROS, Neg liver ROS,   ?Endo/Other  ?negative endocrine ROS ? Renal/GU ?negative Renal ROS  ?negative genitourinary ?  ?Musculoskeletal ?negative musculoskeletal ROS ?(+)  ? Abdominal ?  ?Peds ?negative pediatric ROS ?(+)  Hematology ?negative hematology ROS ?(+)   ?Anesthesia Other Findings ? ? Reproductive/Obstetrics ?negative OB ROS ? ?  ? ? ? ? ? ? ? ? ? ? ? ? ? ?  ?  ? ? ? ? ? ? ? ?Anesthesia Physical ?Anesthesia Plan ? ?ASA: 2 ? ?Anesthesia Plan: General  ? ?Post-op Pain Management: Minimal or no pain anticipated  ? ?Induction: Intravenous ? ?PONV Risk Score and Plan: Propofol infusion ? ?Airway Management Planned: Nasal Cannula ? ?Additional Equipment:  ? ?Intra-op Plan:  ? ?Post-operative Plan:  ? ?Informed Consent: I have reviewed the patients History and Physical, chart, labs and discussed the procedure including the risks, benefits and alternatives for the proposed anesthesia with the patient or authorized representative who has indicated his/her understanding and acceptance.  ? ? ? ?Dental advisory given ? ?Plan Discussed with: CRNA and Surgeon ? ?Anesthesia Plan Comments:   ? ? ? ? ? ? ?Anesthesia Quick Evaluation ? ?

## 2021-08-07 NOTE — Transfer of Care (Signed)
Immediate Anesthesia Transfer of Care Note ? ?Patient: Justin Macdonald ? ?Procedure(s) Performed: COLONOSCOPY WITH PROPOFOL ?POLYPECTOMY INTESTINAL ? ?Patient Location: Endoscopy Unit ? ?Anesthesia Type:General ? ?Level of Consciousness: drowsy ? ?Airway & Oxygen Therapy: Patient Spontanous Breathing ? ?Post-op Assessment: Report given to RN and Post -op Vital signs reviewed and stable ? ?Post vital signs: Reviewed and stable ? ?Last Vitals:  ?Vitals Value Taken Time  ?BP    ?Temp    ?Pulse 85   ?Resp 20   ?SpO2 94%   ? ? ?Last Pain:  ?Vitals:  ? 08/07/21 0755  ?TempSrc:   ?PainSc: 0-No pain  ?   ? ?Patients Stated Pain Goal: 8 (08/07/21 0704) ? ?Complications: No notable events documented. ?

## 2021-08-11 LAB — SURGICAL PATHOLOGY

## 2021-08-12 ENCOUNTER — Encounter (HOSPITAL_COMMUNITY): Payer: Self-pay | Admitting: Internal Medicine

## 2021-11-16 IMAGING — XA DG FLUORO GUIDE NDL PLC/BX
1 series · 1 of 1 positions shown · non-contrast
Comparison: none

CLINICAL DATA: Right wrist pain.

[Series 1: ortho adipose · 1 of 1 slices shown]
[im 1/1]
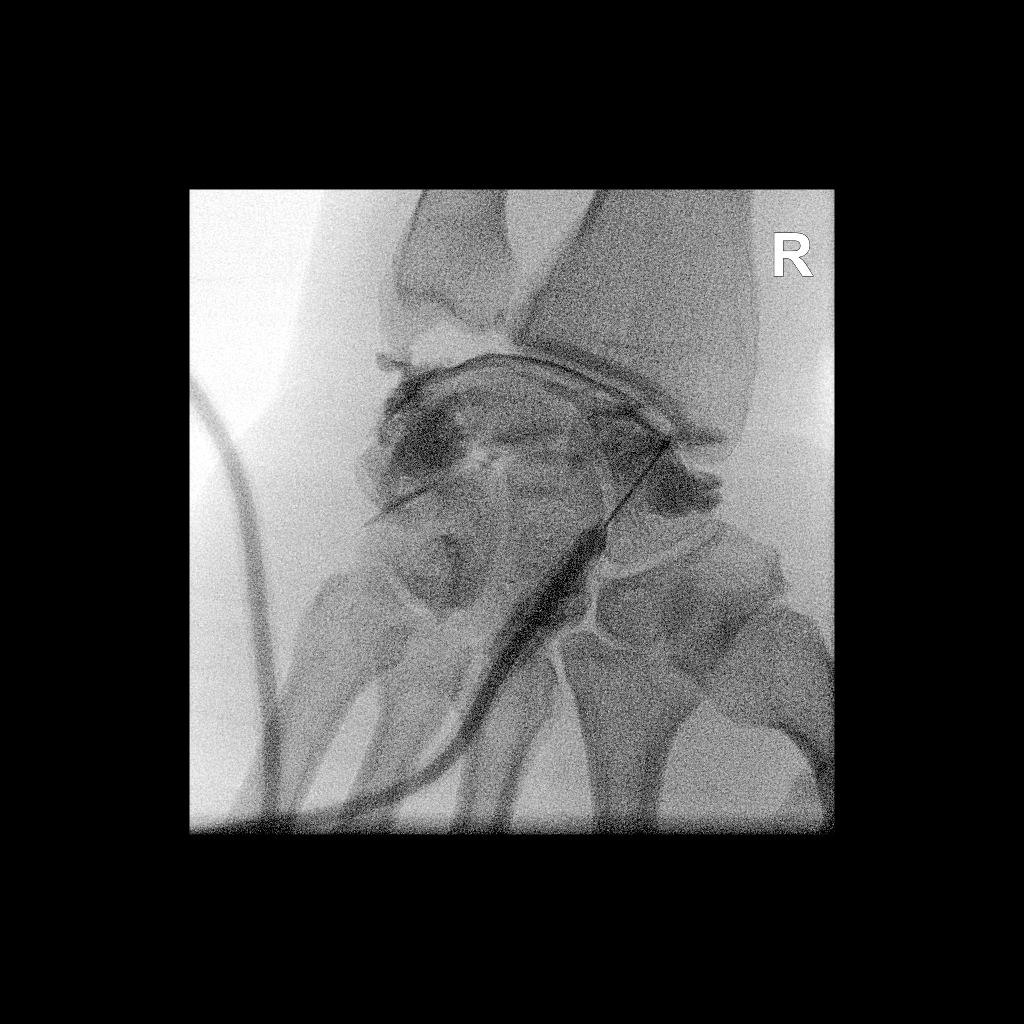

[1 of 1 positions shown; findings below may reference images not displayed]

FLUOROSCOPY TIME:  Fluoroscopy Time: 7 seconds

Radiation Exposure Index: 0.22 microGray*m^2

PROCEDURE:
RIGHT WRIST INJECTION UNDER FLUOROSCOPY

An appropriate skin entrance site was determined. The site was
marked, prepped with Betadine, draped in the usual sterile fashion,
and infiltrated locally with 1% Lidocaine. A 25 gauge skin needle
was advanced into the radiocarpal joint under intermittent
fluoroscopy. A mixture of 0.1 mL of MultiHance, 15 mL of Isovue-M
200, and 5 mL of sterile saline was then used to opacify the
proximal carpal joint. 2 mL of this mixture were injected. No
immediate complication.
IMPRESSION: Technically successful right wrist injection for MRI.

## 2023-07-08 ENCOUNTER — Other Ambulatory Visit: Payer: Self-pay | Admitting: Urology

## 2023-07-08 DIAGNOSIS — M25522 Pain in left elbow: Secondary | ICD-10-CM

## 2023-07-14 ENCOUNTER — Inpatient Hospital Stay: Admission: RE | Admit: 2023-07-14 | Payer: 59 | Source: Ambulatory Visit

## 2023-08-02 ENCOUNTER — Ambulatory Visit
Admission: RE | Admit: 2023-08-02 | Discharge: 2023-08-02 | Disposition: A | Discharge status: Home / Self Care | Payer: BC Managed Care – PPO | Source: Ambulatory Visit | Attending: Urology | Admitting: Urology

## 2023-08-02 DIAGNOSIS — M25522 Pain in left elbow: Secondary | ICD-10-CM
